# Patient Record
Sex: Female | Born: 1985 | Race: White | Hispanic: No | Marital: Married | Smoking: Current every day smoker
Health system: Southern US, Community
[De-identification: ages and names within clinical notes are randomized; demographics above are authoritative.]

## PROBLEM LIST (undated history)

## (undated) DIAGNOSIS — N390 Urinary tract infection, site not specified: Secondary | ICD-10-CM

---

## 2008-04-22 ENCOUNTER — Emergency Department (HOSPITAL_BASED_OUTPATIENT_CLINIC_OR_DEPARTMENT_OTHER): Admission: EM | Admit: 2008-04-22 | Discharge: 2008-04-22 | Payer: Self-pay | Admitting: Emergency Medicine

## 2008-08-27 ENCOUNTER — Emergency Department (HOSPITAL_BASED_OUTPATIENT_CLINIC_OR_DEPARTMENT_OTHER): Admission: EM | Admit: 2008-08-27 | Discharge: 2008-08-27 | Payer: Self-pay | Admitting: Emergency Medicine

## 2008-08-27 ENCOUNTER — Ambulatory Visit: Payer: Self-pay | Admitting: Radiology

## 2009-11-01 ENCOUNTER — Ambulatory Visit: Payer: Self-pay | Admitting: Diagnostic Radiology

## 2009-11-01 ENCOUNTER — Emergency Department (HOSPITAL_BASED_OUTPATIENT_CLINIC_OR_DEPARTMENT_OTHER): Admission: EM | Admit: 2009-11-01 | Discharge: 2009-11-01 | Payer: Self-pay | Admitting: Emergency Medicine

## 2009-11-15 ENCOUNTER — Ambulatory Visit: Payer: Self-pay | Admitting: Radiology

## 2009-11-15 ENCOUNTER — Emergency Department (HOSPITAL_BASED_OUTPATIENT_CLINIC_OR_DEPARTMENT_OTHER): Admission: EM | Admit: 2009-11-15 | Discharge: 2009-11-15 | Payer: Self-pay | Admitting: Emergency Medicine

## 2009-12-17 ENCOUNTER — Emergency Department (HOSPITAL_BASED_OUTPATIENT_CLINIC_OR_DEPARTMENT_OTHER): Admission: EM | Admit: 2009-12-17 | Discharge: 2009-12-18 | Payer: Self-pay | Admitting: Emergency Medicine

## 2009-12-19 ENCOUNTER — Ambulatory Visit (HOSPITAL_BASED_OUTPATIENT_CLINIC_OR_DEPARTMENT_OTHER): Admission: RE | Admit: 2009-12-19 | Discharge: 2009-12-19 | Payer: Self-pay | Admitting: Emergency Medicine

## 2009-12-19 ENCOUNTER — Ambulatory Visit: Payer: Self-pay | Admitting: Radiology

## 2010-07-23 IMAGING — US US OB COMP LESS 14 WK
1 series · 14 of 28 positions shown · non-contrast
Comparison: 11/15/2009

CLINICAL DATA: Pregnant/pelvic pain

OBSTETRIC <14 WK ULTRASOUND
TECHNIQUE:

[Series 1: us ob comp less 14 wk · 0.22mm/px · 14 of 39 slices shown]
[im 2/39]
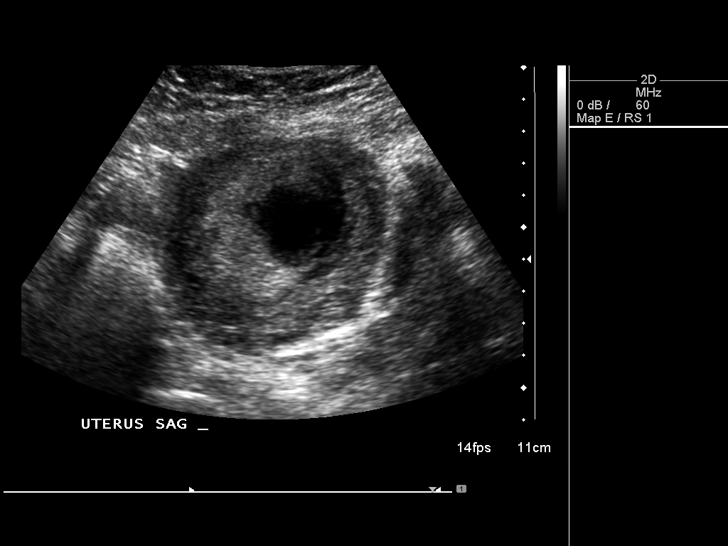
[im 5/39]
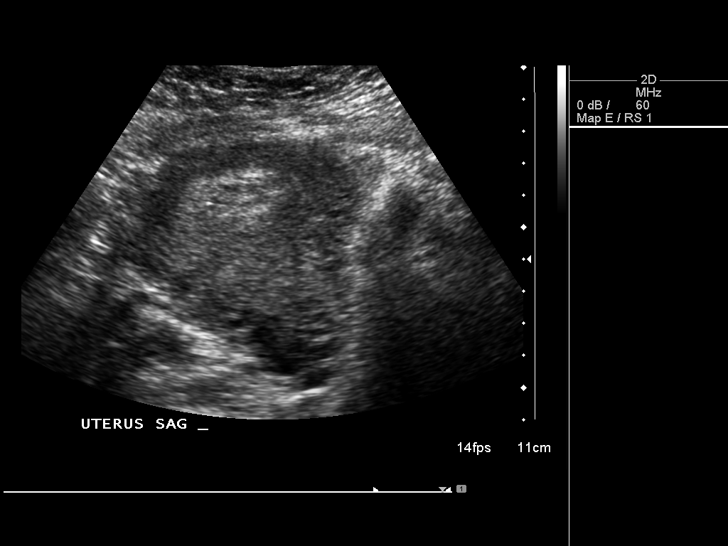
[im 8/39]
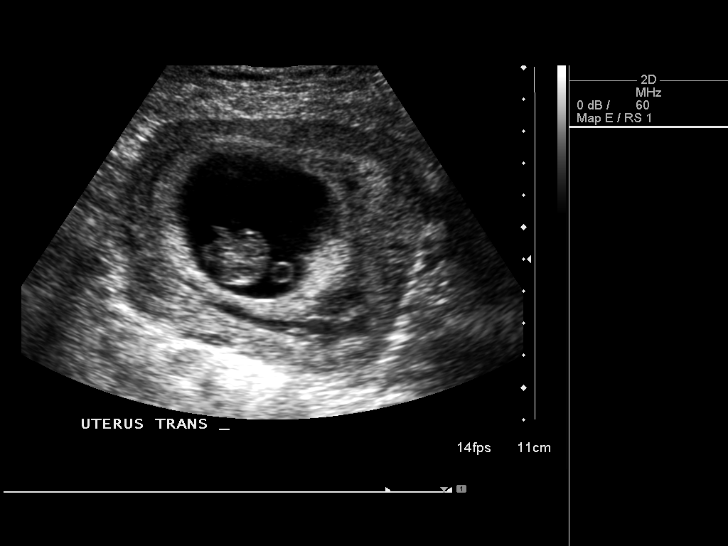
[im 10/39]
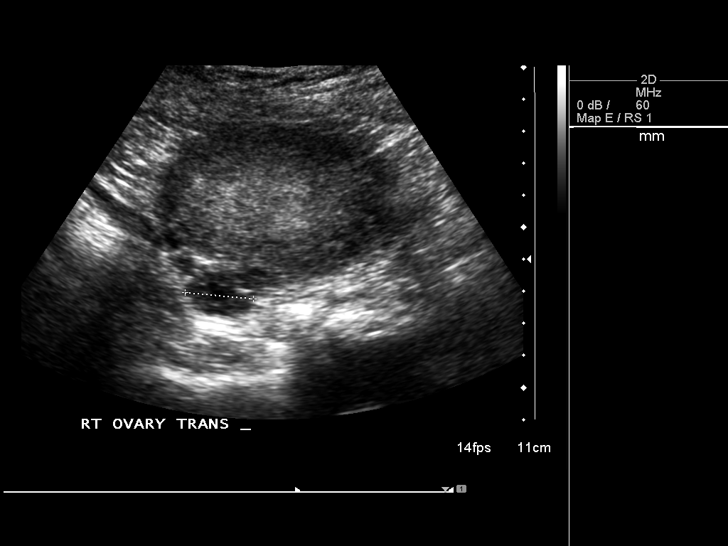
[im 13/39]
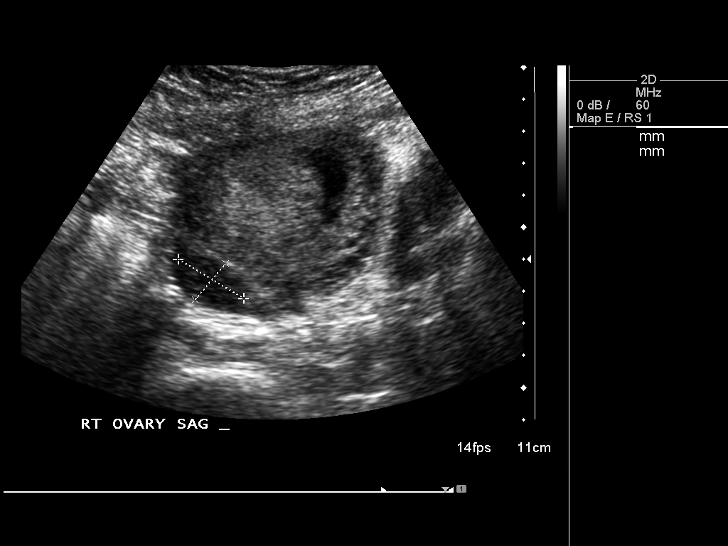
[im 16/39]
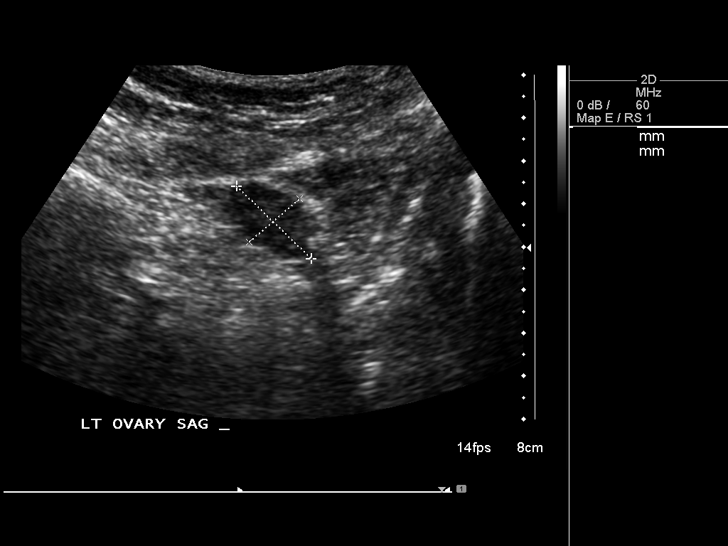
[im 19/39]
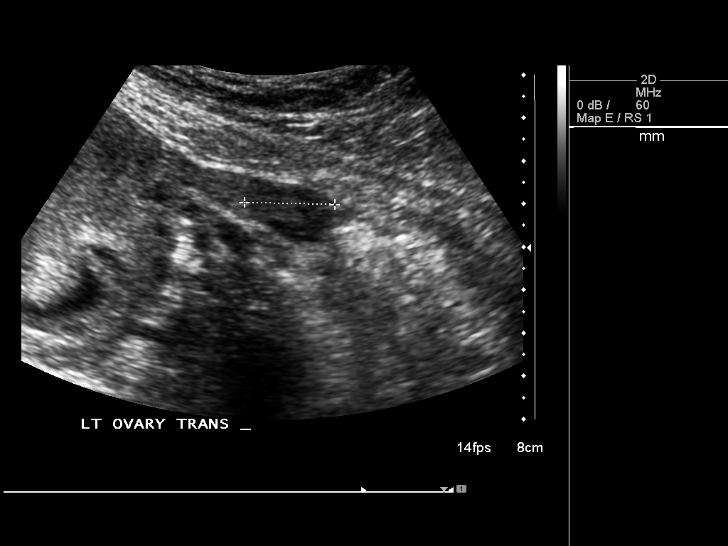
[im 22/39]
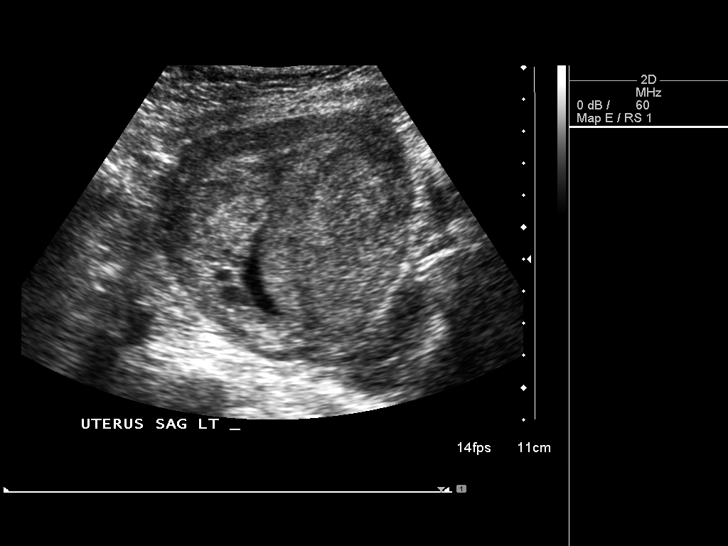
[im 24/39]
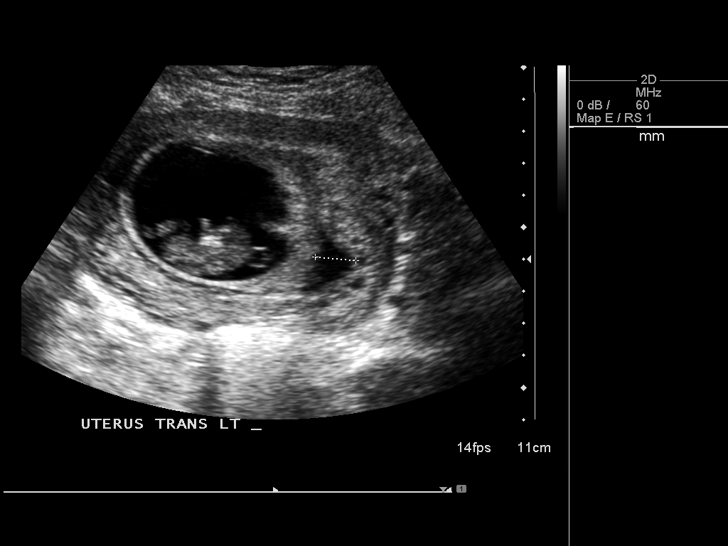
[im 27/39]
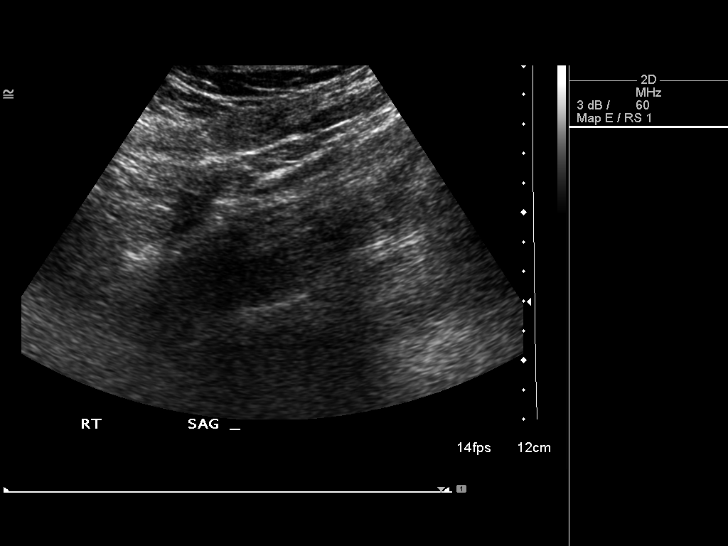
[im 30/39]
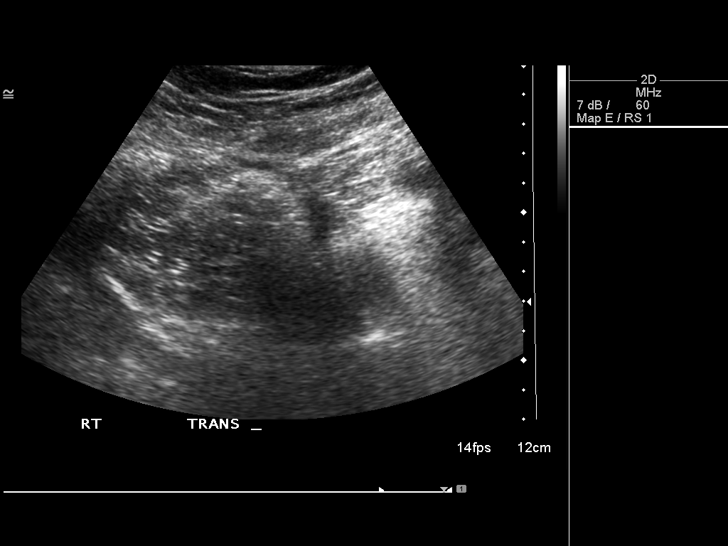
[im 33/39]
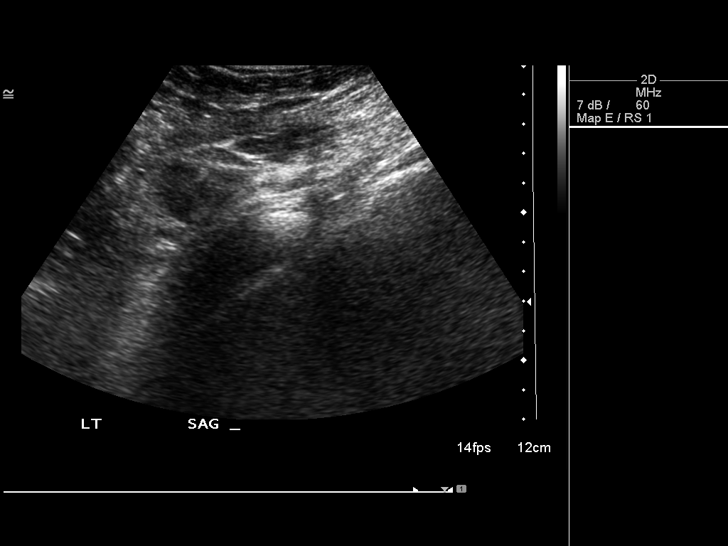
[im 36/39]
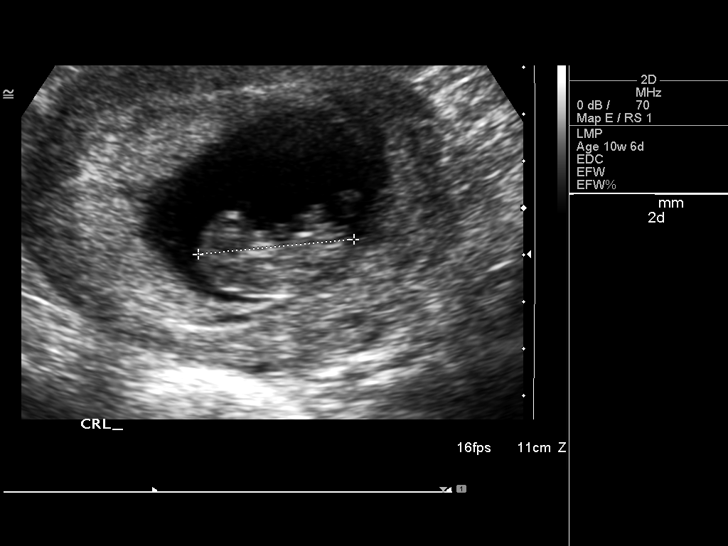
[im 39/39]
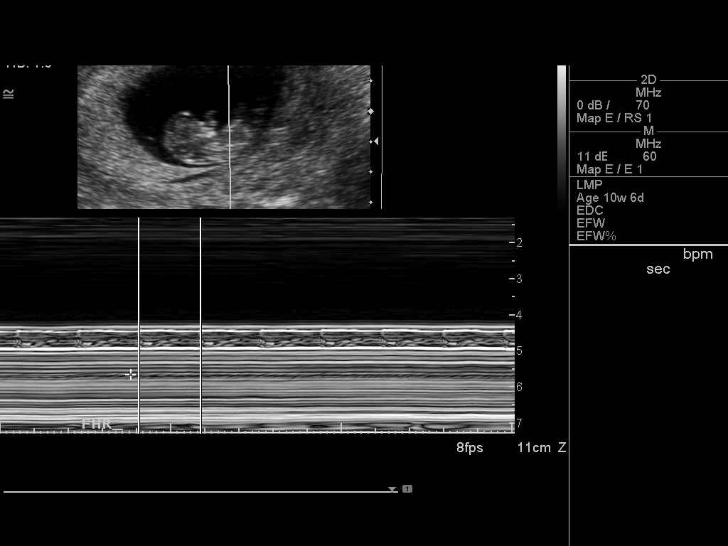

[14 of 28 positions shown; findings below may reference images not displayed]

FINDINGS: There is an intrauterine pregnancy estimated at 10 weeks
1 day based on crown rump length.  Fetal heart activity is recorded
at 165 beats per minute.  There is a probable small to moderate
sized subchorionic hemorrhage.

Ovaries unremarkable.  No adnexal masses or pelvic fluid.
IMPRESSION: 1.  Intrauterine pregnancy estimated at 10 weeks 1 day.  Fetal
heart activity noted at 165 beats per minute.

2.  No other pathological findings.

## 2010-11-07 LAB — URINALYSIS, ROUTINE W REFLEX MICROSCOPIC: Specific Gravity, Urine: 1.02 (ref 1.005–1.030)

## 2010-11-07 LAB — GC/CHLAMYDIA PROBE AMP, GENITAL: Chlamydia, DNA Probe: NEGATIVE

## 2010-11-07 LAB — WET PREP, GENITAL
Trich, Wet Prep: NONE SEEN
Yeast Wet Prep HPF POC: NONE SEEN

## 2010-11-07 LAB — PREGNANCY, URINE: Preg Test, Ur: POSITIVE

## 2010-11-07 LAB — URINE MICROSCOPIC-ADD ON

## 2010-11-12 LAB — BASIC METABOLIC PANEL
GFR calc non Af Amer: >60 mL/min (ref >60)
Glucose, Bld: 85 mg/dL (ref 70–99)
Potassium: 4 mEq/L (ref 3.5–5.1)
Sodium: 140 mEq/L (ref 135–145)

## 2010-11-12 LAB — DIFFERENTIAL
Basophils Relative: 1 % (ref 0–1)
Eosinophils Absolute: 0.1 10*3/uL (ref 0.0–0.7)
Eosinophils Relative: 1 % (ref 0–5)
Lymphocytes Relative: 21 % (ref 12–46)

## 2010-11-12 LAB — URINALYSIS, ROUTINE W REFLEX MICROSCOPIC
Ketones, ur: 15 mg/dL — AB
Ketones, ur: NEGATIVE mg/dL
Protein, ur: 100 mg/dL — AB
Urobilinogen, UA: 0.2 mg/dL (ref 0.0–1.0)

## 2010-11-12 LAB — CBC
HCT: 39.6 % (ref 36.0–46.0)
Hemoglobin: 13.4 g/dL (ref 12.0–15.0)
MCHC: 33.7 g/dL (ref 30.0–36.0)
Platelets: 323 10*3/uL (ref 150–400)
RDW: 13.4 % (ref 11.5–15.5)

## 2010-11-12 LAB — URINE MICROSCOPIC-ADD ON

## 2010-11-12 LAB — URINE CULTURE: Culture: NO GROWTH

## 2010-11-12 LAB — HCG, QUANTITATIVE, PREGNANCY: hCG, Beta Chain, Quant, S: 7126 m[IU]/mL — ABNORMAL HIGH (ref <5)

## 2011-02-09 ENCOUNTER — Emergency Department (HOSPITAL_BASED_OUTPATIENT_CLINIC_OR_DEPARTMENT_OTHER)
Admission: EM | Admit: 2011-02-09 | Discharge: 2011-02-09 | Disposition: A | Discharge status: Home / Self Care | Payer: Self-pay | Attending: Emergency Medicine | Admitting: Emergency Medicine

## 2011-02-09 ENCOUNTER — Emergency Department (INDEPENDENT_AMBULATORY_CARE_PROVIDER_SITE_OTHER): Payer: Self-pay

## 2011-02-09 DIAGNOSIS — O208 Other hemorrhage in early pregnancy: Secondary | ICD-10-CM

## 2011-02-09 DIAGNOSIS — O2690 Pregnancy related conditions, unspecified, unspecified trimester: Secondary | ICD-10-CM | POA: Insufficient documentation

## 2011-02-09 DIAGNOSIS — N390 Urinary tract infection, site not specified: Secondary | ICD-10-CM | POA: Insufficient documentation

## 2011-02-09 DIAGNOSIS — R109 Unspecified abdominal pain: Secondary | ICD-10-CM

## 2011-02-09 DIAGNOSIS — O34599 Maternal care for other abnormalities of gravid uterus, unspecified trimester: Secondary | ICD-10-CM

## 2011-02-09 DIAGNOSIS — O99891 Other specified diseases and conditions complicating pregnancy: Secondary | ICD-10-CM

## 2011-02-09 LAB — URINE MICROSCOPIC-ADD ON

## 2011-02-09 LAB — URINALYSIS, ROUTINE W REFLEX MICROSCOPIC
Bilirubin Urine: NEGATIVE
Glucose, UA: NEGATIVE mg/dL
Ketones, ur: NEGATIVE mg/dL
Nitrite: NEGATIVE
Protein, ur: NEGATIVE mg/dL
Specific Gravity, Urine: 1.023 (ref 1.005–1.030)
Urobilinogen, UA: 1 mg/dL (ref 0.0–1.0)
pH: 6.5 (ref 5.0–8.0)

## 2011-02-09 LAB — CBC
HCT: 37 % (ref 36.0–46.0)
MCHC: 34.1 g/dL (ref 30.0–36.0)
Platelets: 269 10*3/uL (ref 150–400)
RDW: 14.8 % (ref 11.5–15.5)

## 2011-02-09 LAB — HCG, QUANTITATIVE, PREGNANCY: hCG, Beta Chain, Quant, S: 56375 m[IU]/mL — ABNORMAL HIGH

## 2011-02-09 LAB — BASIC METABOLIC PANEL
GFR calc Af Amer: >60 mL/min (ref >60)
GFR calc non Af Amer: >60 mL/min (ref >60)
Potassium: 3.5 mEq/L (ref 3.5–5.1)
Sodium: 137 mEq/L (ref 135–145)

## 2011-02-09 LAB — PREGNANCY, URINE: Preg Test, Ur: POSITIVE

## 2011-03-14 ENCOUNTER — Encounter: Payer: Self-pay | Admitting: *Deleted

## 2011-03-14 ENCOUNTER — Emergency Department (HOSPITAL_BASED_OUTPATIENT_CLINIC_OR_DEPARTMENT_OTHER)
Admission: EM | Admit: 2011-03-14 | Discharge: 2011-03-14 | Disposition: A | Discharge status: Home / Self Care | Payer: Self-pay | Attending: Emergency Medicine | Admitting: Emergency Medicine

## 2011-03-14 DIAGNOSIS — R109 Unspecified abdominal pain: Secondary | ICD-10-CM | POA: Insufficient documentation

## 2011-03-14 DIAGNOSIS — O26899 Other specified pregnancy related conditions, unspecified trimester: Secondary | ICD-10-CM

## 2011-03-14 DIAGNOSIS — O2690 Pregnancy related conditions, unspecified, unspecified trimester: Secondary | ICD-10-CM | POA: Insufficient documentation

## 2011-03-14 LAB — URINE MICROSCOPIC-ADD ON

## 2011-03-14 LAB — URINALYSIS, ROUTINE W REFLEX MICROSCOPIC
Bilirubin Urine: NEGATIVE
Glucose, UA: NEGATIVE mg/dL
Ketones, ur: NEGATIVE mg/dL
Leukocytes, UA: NEGATIVE
Nitrite: NEGATIVE
Protein, ur: NEGATIVE mg/dL
Specific Gravity, Urine: 1.038 — ABNORMAL HIGH (ref 1.005–1.030)
Urobilinogen, UA: 0.2 mg/dL (ref 0.0–1.0)
pH: 5.5 (ref 5.0–8.0)

## 2011-03-14 MED ORDER — ONDANSETRON HCL 4 MG PO TABS: 4.0000 mg | ORAL_TABLET | Freq: Four times a day (QID) | ORAL | Status: AC

## 2011-03-14 NOTE — ED Provider Notes (Signed)
History     Chief Complaint  Patient presents with  . Abdominal Pain   Patient is a 25 y.o. female presenting with abdominal pain. The history is provided by the patient.  Abdominal Pain The primary symptoms of the illness include abdominal pain, nausea and hematemesis. The primary symptoms of the illness do not include fever, shortness of breath, vomiting, diarrhea, dysuria, vaginal discharge or vaginal bleeding. The current episode started 13 to 24 hours ago. The onset of the illness was gradual. Progression since onset: comes and goes with position and movement.  Pain Location: bilateral lower pelvic region. The abdominal pain is relieved by certain positions. Exacerbated by: nothing.  The hematemesis is not associated with heartburn, weakness or dizziness.  The patient states that she believes she is currently pregnant. The patient has not had a change in bowel habit. Symptoms associated with the illness do not include chills, diaphoresis, heartburn, urgency, frequency or back pain. Significant associated medical issues do not include diabetes.  LMP 12/06/10 is G7P6 and unable to get into her OB office.  She sttrated having this position discomfort tonight and believes it is round ligament pains, she took tylenol around 11pm and is now feeling better, she has been having some am emesis that she attributes to pregnancy and does not feel likel it is related to this pain tonight. No trauma, no vag bleeding, is taking PNVs.   History reviewed. No pertinent past medical history.  History reviewed. No pertinent past surgical history.  History reviewed. No pertinent family history.  History  Substance Use Topics  . Smoking status: Never Smoker   . Smokeless tobacco: Not on file  . Alcohol Use: No    OB History    Grav Para Term Preterm Abortions TAB SAB Ect Mult Living   7 6 5 1      6       Review of Systems  Constitutional: Negative for fever, chills and diaphoresis.  HENT: Negative  for congestion and neck pain.   Eyes: Negative for discharge.  Respiratory: Negative for shortness of breath.   Cardiovascular: Negative for chest pain and leg swelling.  Gastrointestinal: Positive for nausea, abdominal pain and hematemesis. Negative for heartburn, vomiting and diarrhea.  Genitourinary: Negative for dysuria, urgency, frequency, vaginal bleeding, vaginal discharge and vaginal pain.  Musculoskeletal: Negative for back pain.  Skin: Negative for rash.  Neurological: Negative for dizziness and weakness.  All other systems reviewed and are negative.    Physical Exam  BP 105/63  Pulse 76  Temp(Src) 97.5 F (36.4 C) (Oral)  Resp 18  Ht 5\' 3"  (1.6 m)  Wt 165 lb (74.844 kg)  BMI 29.23 kg/m2  SpO2 100%  LMP 12/06/2010  Physical Exam  Constitutional: She is oriented to person, place, and time. She appears well-developed and well-nourished.  HENT:  Head: Normocephalic and atraumatic.  Eyes: Conjunctivae and EOM are normal. Pupils are equal, round, and reactive to light.  Neck: Trachea normal. Neck supple. No thyromegaly present.  Cardiovascular: Normal rate, regular rhythm, S1 normal, S2 normal and normal pulses.     No systolic murmur is present   No diastolic murmur is present  Pulses:      Radial pulses are 2+ on the right side, and 2+ on the left side.  Pulmonary/Chest: Effort normal and breath sounds normal. She has no wheezes. She has no rhonchi. She has no rales. She exhibits no tenderness.  Abdominal: Soft. Normal appearance and bowel sounds are normal. There is  no tenderness. There is no rigidity, no CVA tenderness and negative Murphy's sign. No hernia.       Gravid uterus, no point tenderness and is NTTP over McBurneys point and neg Murphys sign. No peritonits or rebound. Localizes discomfort to bilateral lower inguinal and pelvic region  Musculoskeletal:       BLE:s Calves nontender, no cords or erythema, negative Homans sign  Neurological: She is alert and  oriented to person, place, and time. She has normal strength. No cranial nerve deficit or sensory deficit. GCS eye subscore is 4. GCS verbal subscore is 5. GCS motor subscore is 6.  Skin: Skin is warm and dry. No rash noted. She is not diaphoretic.  Psychiatric: Her speech is normal.       Cooperative and appropriate    ED Course  [place] bedside Date/Time: 03/14/2011 3:40 AM Performed by: Sunnie Nielsen Authorized by: Sunnie Nielsen Consent: Verbal consent obtained. Risks and benefits: risks, benefits and alternatives were discussed Consent given by: patient Time out: Immediately prior to procedure a "time out" was called to verify the correct patient, procedure, equipment, support staff and site/side marked as required. Comments: First trimester preg [place] for ABD pain and known pregnancy: FHR measured 162 and estimated gest age 37 weeks 4 days by BPD    MDM No RLQ pain or tenderness, tylenol improved symptoms, suspect round ligament pains, plan close OB GYN follow up with [facility] referral.  No UTI symptoms. Reliable historian agrees to strict return precautions and f/u instructions.       Sunnie Nielsen, MD 03/14/11 [phone]

## 2011-03-14 NOTE — ED Notes (Signed)
Pt c/o lower abd pain and back pain for few hours. Pt denies urinary sxs or fever. Pt states that her LMP was in April; and has had + positive pregnancy tests. Was seen here last month for the same.

## 2011-03-14 NOTE — ED Notes (Signed)
Dr Dierdre Highman with pt

## 2011-03-14 NOTE — ED Notes (Signed)
Pt denies any abd pain only pressure when urinating, same sxs as last month when she was here and dx'd with UTI. Denies any blood or vaginal discharge. Denies fever. Pt has had no prenatal care up to this point due to being denied medicaid but states that she is taking prenatal vitamins.

## 2011-03-14 NOTE — ED Notes (Signed)
BS ultrasound performed by Dr Dierdre Highman, pt tolerated well. Pt given PO fluids per request.

## 2011-05-23 LAB — BASIC METABOLIC PANEL
CO2: 29
Calcium: 9.3
Creatinine, Ser: 0.7
GFR calc Af Amer: >60
GFR calc non Af Amer: >60
Glucose, Bld: 87
Sodium: 141

## 2011-05-23 LAB — DIFFERENTIAL
Basophils Absolute: 0
Basophils Relative: 0
Lymphocytes Relative: 22
Monocytes Absolute: 0.4
Neutro Abs: 6.2
Neutrophils Relative %: 71

## 2011-05-23 LAB — CBC
Hemoglobin: 14.7
MCHC: 34
Platelets: 279
RDW: 14

## 2011-05-23 LAB — D-DIMER, QUANTITATIVE: D-Dimer, Quant: 0.26

## 2011-10-15 ENCOUNTER — Emergency Department (HOSPITAL_BASED_OUTPATIENT_CLINIC_OR_DEPARTMENT_OTHER)
Admission: EM | Admit: 2011-10-15 | Discharge: 2011-10-15 | Disposition: A | Discharge status: Home / Self Care | Payer: Medicaid Other | Attending: Emergency Medicine | Admitting: Emergency Medicine

## 2011-10-15 ENCOUNTER — Encounter (HOSPITAL_BASED_OUTPATIENT_CLINIC_OR_DEPARTMENT_OTHER): Payer: Self-pay

## 2011-10-15 DIAGNOSIS — S161XXA Strain of muscle, fascia and tendon at neck level, initial encounter: Secondary | ICD-10-CM

## 2011-10-15 DIAGNOSIS — X503XXA Overexertion from repetitive movements, initial encounter: Secondary | ICD-10-CM | POA: Insufficient documentation

## 2011-10-15 DIAGNOSIS — F17200 Nicotine dependence, unspecified, uncomplicated: Secondary | ICD-10-CM | POA: Insufficient documentation

## 2011-10-15 DIAGNOSIS — M25519 Pain in unspecified shoulder: Secondary | ICD-10-CM | POA: Insufficient documentation

## 2011-10-15 DIAGNOSIS — Y92009 Unspecified place in unspecified non-institutional (private) residence as the place of occurrence of the external cause: Secondary | ICD-10-CM | POA: Insufficient documentation

## 2011-10-15 DIAGNOSIS — S139XXA Sprain of joints and ligaments of unspecified parts of neck, initial encounter: Secondary | ICD-10-CM | POA: Insufficient documentation

## 2011-10-15 MED ORDER — IBUPROFEN 100 MG/5ML PO SUSP
800.0000 mg | Freq: Once | ORAL | Status: AC
  Administered 2011-10-15: 800 mg via ORAL
  Filled 2011-10-15: qty 40

## 2011-10-15 MED ORDER — IBUPROFEN 100 MG/5ML PO SUSP: ORAL | Status: DC

## 2011-10-15 NOTE — ED Provider Notes (Signed)
Medical screening examination/treatment/procedure(s) were performed by non-physician practitioner and as supervising physician I was immediately available for consultation/collaboration.   Glynn Octave, MD 10/15/11 [phone]

## 2011-10-15 NOTE — ED Provider Notes (Signed)
History     CSN: [id number]  Arrival date & time 10/15/11  1140   First MD Initiated Contact with Patient 10/15/11 1153      Chief Complaint  Patient presents with  . Shoulder Pain    (Consider location/radiation/quality/duration/timing/severity/associated sxs/prior treatment) HPI  Patient presents to ER complaining of gradual onset left lateral lower neck/upper shoulder pain that began 6 days ago stating that she moved furniture all day one day and then by the evening time noticed "soreness in neck and shoulder" stating that she woke the next day with increased pain that has been persistent since onset. Patient states pain is aggravated by movement of neck and shoulder but denies pain without shoulder joint. Patient states she is currently breast feeding and has taken tylenol without relief of symptoms. Patient notes that she is unable to swallow pills and therefore takes medication in liquid form. Patient denies extremity numbness/tingling/weakness. She states that she has 7 children and is "never able to take break from doing work around the house" stating that neck and shoulder ache more so at the end of the day. She has no known medical problems and takes no meds on a regular basis.   History reviewed. No pertinent past medical history.  History reviewed. No pertinent past surgical history.  History reviewed. No pertinent family history.  History  Substance Use Topics  . Smoking status: Current Everyday Smoker -- 1.0 packs/day    Types: Cigarettes  . Smokeless tobacco: Never Used  . Alcohol Use: No    OB History    Grav Para Term Preterm Abortions TAB SAB Ect Mult Living   7 6 5 1      6       Review of Systems  All other systems reviewed and are negative.    Allergies  Review of patient's allergies indicates no known allergies.  Home Medications   Current Outpatient Rx  Name Route Sig Dispense Refill  . ACETAMINOPHEN 500 MG PO TABS Oral Take 1,000 mg by mouth  every 6 (six) hours as needed. Pt states that she is taking an elixir of 500mg /tablespoon.    . IBUPROFEN 100 MG/5ML PO SUSP  Take 600mg  every 6-8 hours as needed for inflammation and pain 473 mL 0    BP 128/74  Pulse 75  Temp(Src) 97.6 F (36.4 C) (Oral)  Resp 18  Ht 5\' 3"  (1.6 m)  SpO2 99%  LMP 12/06/2010  Physical Exam  Constitutional: She is oriented to person, place, and time. She appears well-developed and well-nourished. No distress.  HENT:  Head: Normocephalic and atraumatic.  Eyes: Conjunctivae are normal.  Neck: Normal range of motion. Neck supple. No tracheal deviation present.       Soft tissue TTP of left lateral neck into trapezius muscle but no skin changes or crepitous  Cardiovascular: Normal rate, regular rhythm, S1 normal, S2 normal, normal heart sounds and intact distal pulses.   Pulmonary/Chest: Effort normal and breath sounds normal. No respiratory distress. She has no wheezes. She has no rales. She exhibits no tenderness and no crepitus.  Abdominal: Soft. Normal appearance and bowel sounds are normal. She exhibits no distension and no mass. There is no tenderness. There is no rebound and no guarding.  Musculoskeletal: Normal range of motion. She exhibits no edema and no tenderness.       Right shoulder: She exhibits normal range of motion, no tenderness, no swelling, no effusion and no deformity.       FROM of  bilateral UE and LE with mild pain in left trapezius region with ROM of shoulder but no pain in left shoulder joint. Negative drop can test. 5/5 strength of bilateral UE with FROM of UE.   Neurological: She is alert and oriented to person, place, and time. She has normal reflexes.  Skin: Skin is warm and dry. She is not diaphoretic.  Psychiatric: She has a normal mood and affect.    ED Course  Procedures (including critical care time)  PO ibuprofen  Labs Reviewed - No data to display No results found.   1. Cervical strain       MDM  MSK soft  tissue TTP of trapezius region without signs or symptoms of central cord compression. FROM of UE with 5/5 strength and neurovasc intact.         Jenness Corner, [place] 10/15/11 1225

## 2011-10-15 NOTE — ED Notes (Signed)
Pt states that she has been moving furniture and injured her L shoulder during this process.  Pt has been taking tylenol for the pain with no relief.  Pt states that pain gets worse with movement of arm, and when she turns her neck.  ROM good, pulses present and equal in both upper extremities, sensation present and equal.

## 2011-10-15 NOTE — Discharge Instructions (Signed)
Apply ice to neck/shoulder for 15 minutes out of every waking hour for the next 24-48 hours then move to moist heat therapy. Use Moist heat therapy by taking a dish rag and soaking in water then ringing out excess water then microwave for 10-15 seconds until hot but not so hot that it would burn and heat to areas of soreness for 15 minutes, multiple times a day. Alternate between tylenol and ibuprofen for pain. Establishment with a Primary Care provider is Very important for general health care concerns, minor illness and minor injury.   Cervical Sprain and Strain A cervical sprain is an injury to the neck. The injury can include either over-stretching or even small tears in the ligaments that hold the bones of the neck in place. A strain affects muscles and tendons. Minor injuries usually only involve ligaments and muscles. Because the different parts of the neck are so close together, more severe injuries can involve both sprain and strain. These injuries can affect the muscles, ligaments, tendons, discs, and nerves in the neck. CAUSES  An injury may be the result of a direct blow or from certain habits that can lead to the symptoms noted above.  Injury from:   Contact sports (such as football, rugby, wrestling, hockey, auto racing, gymnastics, diving, martial arts, and boxing).   Motor vehicle accidents.   Whiplash injuries (see image at right). These are common. They occur when the neck is forcefully whipped or forced backward and/or forward.   Falls.   Lifestyle or awkward postures:   Cradling a telephone between the ear and shoulder.   Sitting in a chair that offers no support.   Working at an Theme park manager station.   Activities that require hours of repeated or long periods of looking up (stretching the neck backward) or looking down (bending the head/neck forward).  SYMPTOMS   Pain, soreness, stiffness, or burning sensation in the front, back, or sides of the neck. This  may develop immediately after injury. Onset of discomfort may also develop slowly and not begin for 24 hours or more.   Shoulder and/or upper back pain.   Limits to the normal movement of the neck.   Headache.   Dizziness.   Weakness and/or abnormal sensation (such as numbness or tingling) of one or both arms and/or hands.   Muscle spasm.   Difficulty with swallowing or chewing.   Tenderness and swelling at the injury site.  DIAGNOSIS  Most of the time, your caregiver can diagnose this problem with a careful history and examination. The history will include information about known problems (such as arthritis in the neck) or a previous neck injury. X-rays may be ordered to find out if there is a different problem. X-rays can also help to find problems with the bones of the neck not related to the injury or current symptoms. TREATMENT  Several treatment options are available to help pain, spasm, and other symptoms. They include:  Cold helps relieve pain and reduce inflammation. Cold should be applied for 10 to 15 minutes every 2 to 3 hours after any activity that aggravates your symptoms. Use ice packs or an ice massage. Place a towel or cloth in between your skin and the ice pack.   Medication:   Only take over-the-counter or prescription medicines for pain, discomfort, or fever as directed by your caregiver.   Pain relievers or muscle relaxants may be prescribed. Use only as directed and only as much as you need.   Change  in the activity that caused the problem. This might include using a headset with a telephone so that the phone is not propped between your ear and shoulder.   Neck collar. Your caregiver may recommend temporary use of a soft cervical collar.   Work station. Changes may be needed in your work place. A better sitting position and/or better posture during work may be part of your treatment.   Physical Therapy. Your caregiver may recommend physical therapy. This can  include instructions in the use of stretching and strengthening exercises. Improvement in posture is important. Exercises and posture training can help stabilize the neck and strengthen muscles and keep symptoms from returning.  HOME CARE INSTRUCTIONS  Other than formal physical therapy, all treatments above can be done at home. Even when not at work, it is important to be conscious of your posture and of activities that can cause a return of symptoms. Most cervical sprains and/or strains are better in 1-3 weeks. As you improve and increase activities, doing a warm up and stretching before the activity will help prevent recurrent problems. SEEK MEDICAL CARE IF:   Pain is not effectively controlled with medication.   You feel unable to decrease pain medication over time as planned.   Activity level is not improving as planned and/or expected.  SEEK IMMEDIATE MEDICAL CARE IF:   While using medication, you develop any bleeding, stomach upset, or signs of an allergic reaction.   Symptoms get worse, become intolerable, and are not helped by medications.   New, unexplained symptoms develop.   You experience numbness, tingling, weakness, or paralysis of any part of your body.  MAKE SURE YOU:   Understand these instructions.   Will watch your condition.   Will get help right away if you are not doing well or get worse.  Document Released: 06/03/2007 Document Revised: 04/18/2011 Document Reviewed: 06/03/2007 [facility] Patient Information 2012 [place], [place].

## 2011-11-07 ENCOUNTER — Emergency Department (HOSPITAL_BASED_OUTPATIENT_CLINIC_OR_DEPARTMENT_OTHER)
Admission: EM | Admit: 2011-11-07 | Discharge: 2011-11-07 | Disposition: A | Discharge status: Home / Self Care | Payer: Medicaid Other | Attending: Emergency Medicine | Admitting: Emergency Medicine

## 2011-11-07 ENCOUNTER — Emergency Department (INDEPENDENT_AMBULATORY_CARE_PROVIDER_SITE_OTHER): Payer: Medicaid Other

## 2011-11-07 ENCOUNTER — Encounter (HOSPITAL_BASED_OUTPATIENT_CLINIC_OR_DEPARTMENT_OTHER): Payer: Self-pay | Admitting: Student

## 2011-11-07 DIAGNOSIS — R109 Unspecified abdominal pain: Secondary | ICD-10-CM

## 2011-11-07 DIAGNOSIS — E876 Hypokalemia: Secondary | ICD-10-CM

## 2011-11-07 LAB — URINALYSIS, ROUTINE W REFLEX MICROSCOPIC
Nitrite: NEGATIVE
Protein, ur: 100 mg/dL — AB
Specific Gravity, Urine: 1.038 — ABNORMAL HIGH (ref 1.005–1.030)
Urobilinogen, UA: 1 mg/dL (ref 0.0–1.0)

## 2011-11-07 LAB — URINE MICROSCOPIC-ADD ON

## 2011-11-07 LAB — PREGNANCY, URINE: Preg Test, Ur: NEGATIVE

## 2011-11-07 LAB — BASIC METABOLIC PANEL
CO2: 27 mEq/L (ref 19–32)
Calcium: 9.5 mg/dL (ref 8.4–10.5)
Creatinine, Ser: 0.6 mg/dL (ref 0.50–1.10)
GFR calc non Af Amer: >90 mL/min (ref >90)
Sodium: 139 mEq/L (ref 135–145)

## 2011-11-07 MED ORDER — ONDANSETRON HCL 4 MG/2ML IJ SOLN
4.0000 mg | Freq: Once | INTRAMUSCULAR | Status: DC
  Filled 2011-11-07: qty 2

## 2011-11-07 MED ORDER — MORPHINE SULFATE 4 MG/ML IJ SOLN
4.0000 mg | Freq: Once | INTRAMUSCULAR | Status: DC
  Filled 2011-11-07: qty 1

## 2011-11-07 NOTE — ED Notes (Signed)
Pt in with c/o sudden onset of right flank pain radiating to right lower quad at 0400 this morning. +N but denies V D CP LOC SOB or dysuria

## 2011-11-07 NOTE — ED Provider Notes (Signed)
History     CSN: [id number]  Arrival date & time 11/07/11  1554   First MD Initiated Contact with Patient 11/07/11 1617      Chief Complaint  Patient presents with  . Flank Pain    right  . Abdominal Pain    right    (Consider location/radiation/quality/duration/timing/severity/associated sxs/prior treatment) HPI Comments: Pt states that she had a baby 6 weeks ago and had normal check up with her ob this week  Patient is a 26 y.o. female presenting with flank pain and abdominal pain. The history is provided by the patient.  Flank Pain This is a new problem. The current episode started today. The problem occurs intermittently. The problem has been unchanged. Associated symptoms include abdominal pain. Pertinent negatives include no coughing, fever, headaches or vomiting. The symptoms are aggravated by nothing. She has tried nothing for the symptoms.  Abdominal Pain The primary symptoms of the illness include abdominal pain. The primary symptoms of the illness do not include fever or vomiting.    History reviewed. No pertinent past medical history.  History reviewed. No pertinent past surgical history.  History reviewed. No pertinent family history.  History  Substance Use Topics  . Smoking status: Current Everyday Smoker -- 1.0 packs/day    Types: Cigarettes  . Smokeless tobacco: Never Used  . Alcohol Use: No    OB History    Grav Para Term Preterm Abortions TAB SAB Ect Mult Living   7 6 5 1      6       Review of Systems  Constitutional: Negative for fever.  Respiratory: Negative for cough.   Gastrointestinal: Positive for abdominal pain. Negative for vomiting.  Genitourinary: Positive for flank pain.  Neurological: Negative for headaches.  All other systems reviewed and are negative.    Allergies  Review of patient's allergies indicates no known allergies.  Home Medications   Current Outpatient Rx  Name Route Sig Dispense Refill  . IBUPROFEN 100 MG/5ML  PO SUSP  Take 600mg  every 6-8 hours as needed for inflammation and pain 473 mL 0    BP 122/82  Pulse 80  Temp(Src) 98.2 F (36.8 C) (Oral)  Resp 20  Wt 176 lb (79.833 kg)  SpO2 98%  LMP 12/06/2010  Breastfeeding? Yes  Physical Exam  Nursing note and vitals reviewed. Constitutional: She is oriented to person, place, and time. She appears well-developed and well-nourished.  Eyes: Conjunctivae and EOM are normal.  Neck: Neck supple.  Cardiovascular: Normal rate and regular rhythm.   Pulmonary/Chest: Effort normal and breath sounds normal.  Abdominal: Soft. Bowel sounds are normal. There is no tenderness. There is no CVA tenderness.  Musculoskeletal: Normal range of motion.  Neurological: She is alert and oriented to person, place, and time.  Skin: Skin is warm and dry.  Psychiatric: She has a normal mood and affect.    ED Course  Procedures (including critical care time)  Labs Reviewed  URINALYSIS, ROUTINE W REFLEX MICROSCOPIC - Abnormal; Notable for the following:    APPearance CLOUDY (*)    Specific Gravity, Urine 1.038 (*)    Hgb urine dipstick MODERATE (*)    Protein, ur 100 (*)    Leukocytes, UA TRACE (*)    All other components within normal limits  BASIC METABOLIC PANEL - Abnormal; Notable for the following:    Potassium 3.1 (*)    All other components within normal limits  URINE MICROSCOPIC-ADD ON - Abnormal; Notable for the following:  Squamous Epithelial / LPF FEW (*)    Bacteria, UA FEW (*)    All other components within normal limits  PREGNANCY, URINE  URINE CULTURE   Ct Abdomen Pelvis Wo Contrast  11/07/2011  *RADIOLOGY REPORT*  Clinical Data: FLANK PAIN ABDOMINAL PAIN  CT ABDOMEN AND PELVIS WITHOUT CONTRAST  Technique:  Multidetector CT imaging of the abdomen and pelvis was performed following the standard protocol without intravenous contrast.  Comparison: None.  Findings:  Visualized lung bases are clear.  Liver is normal.  Spleen is normal.  Pancreas  is normal.  The adrenal glands are normal.   The kidneys are normal.  Gallbladder is unremarkable by CT.  No biliary ductal dilation.  Stomach and visualized large and small bowel are unremarkable.  Abdominal aorta is normal in caliber.  No significant lymphadenopathy.  No free fluid or abnormal fluid collections.  Appendix identified and normal.  Visualized colon and small bowel are unremarkable.  No free fluid or abnormal fluid collections.  No significant lymphadenopathy.  Urinary bladder is normal.  IMPRESSION:  1.  There is no evidence for nephrolithiasis or obstructive uropathy. 2.  No ureterolithiasis identified.  3.  The appendix is visualized and appears normal.  Original Report Authenticated By: Rosealee Albee, M.D.     1. Flank pain   2. Hypokalemia       MDM  No acute findings:pt has chronic history of hematuria:no definite infection:urine culture sent:likely musculoskeletal       Teressa Lower, NP 11/07/11 Rickey Primus

## 2011-11-07 NOTE — ED Notes (Signed)
Cleatrice Burke, RN stated pt refused pain/nausea med at present-EDP [facility] notified

## 2011-11-07 NOTE — Discharge Instructions (Signed)
Flank Pain Flank pain is pain in your side.  HOME CARE  Home care and treatment will depend on the cause of your pain.   Some medicines can help relieve the pain. Take all medicine as told by your doctor.   Tell your doctor about any changes in your pain.   Follow up with your doctor.  GET HELP RIGHT AWAY IF:   Your pain does not get better with medicine.   The pain gets worse.   You have belly (abdominal) pain.   You are short of breath.   You always feel sick to your stomach (nauseous).   You keep throwing up (vomiting).   You have puffiness (swelling) in your belly.   You pass out (faint).   You have a temperature by mouth above 102 F (38.9 C), not controlled by medicine.  MAKE SURE YOU:   Understand these instructions.   Will watch your condition.   Will get help right away if you are not doing well or get worse.  Document Released: 05/15/2008 Document Revised: 07/26/2011 Document Reviewed: 01/21/2010 [facility] Patient Information 2012 [place], [place].Hypokalemia Hypokalemia means a low potassium level in the blood.Potassium is an electrolyte that helps regulate the amount of fluid in the body. It also stimulates muscle contraction and maintains a stable acid-base balance.Most of the body's potassium is inside of cells, and only a very small amount is in the blood. Because the amount in the blood is so small, minor changes can have big effects. PREPARATION FOR TEST Testing for potassium requires taking a blood sample taken by needle from a vein in the arm. The skin is cleaned thoroughly before the sample is drawn. There is no other special preparation needed. NORMAL VALUES Potassium levels below 3.5 mEq/L are abnormally low. Levels above 5.1 mEq/L are abnormally high. Ranges for normal findings may vary among different laboratories and hospitals. You should always check with your doctor after having lab work or other tests done to discuss the meaning of your test  results and whether your values are considered within normal limits. MEANING OF TEST  Your caregiver will go over the test results with you and discuss the importance and meaning of your results, as well as treatment options and the need for additional tests, if necessary. A potassium level is frequently part of a routine medical exam. It is usually included as part of a whole "panel" of tests for several blood salts (such as Sodium and Chloride). It may be done as part of follow-up when a low potassium level was found in the past or other blood salts are suspected of being out of balance. A low potassium level might be suspected if you have one or more of the following:  Symptoms of weakness.   Abnormal heart rhythms.   High blood pressure and are taking medication to control this, especially water pills (diuretics).   Kidney disease that can affect your potassium level .   Diabetes requiring the use of insulin. The potassium may fall after taking insulin, especially if the diabetes had been out of control for a while.   A condition requiring the use of cortisone-type medication or certain types of antibiotics.   Vomiting and/or diarrhea for more than a day or two.   A stomach or intestinal condition that may not permit appropriate absorption of potassium.   Fainting episodes.   Mental confusion.  OBTAINING TEST RESULTS It is your responsibility to obtain your test results. Ask the lab or department  performing the test when and how you will get your results.  Please contact your caregiver directly if you have not received the results within one week. At that time, ask if there is anything different or new you should be doing in relation to the results. TREATMENT Hypokalemia can be treated with potassium supplements taken by mouth and/or adjustments in your current medications. A diet high in potassium is also helpful. Foods with high potassium content are:  Peas, lentils, lima  beans, nuts, and dried fruit.   Whole grain and bran cereals and breads.   Fresh fruit, vegetables (bananas, cantaloupe, grapefruit, oranges, tomatoes, honeydew melons, potatoes).   Orange and tomato juices.   Meats. If potassium supplement has been prescribed for you today or your medications have been adjusted, see your personal caregiver in time02 for a re-check.  SEEK MEDICAL CARE IF:  There is a feeling of worsening weakness.   You experience repeated chest palpitations.   You are diabetic and having difficulty keeping your blood sugars in the normal range.   You are experiencing vomiting and/or diarrhea.   You are having difficulty with any of your regular medications.  SEEK IMMEDIATE MEDICAL CARE IF:  You experience chest pain, shortness of breath, or episodes of dizziness.   You have been having vomiting or diarrhea for more than 2 days.   You have a fainting episode.  MAKE SURE YOU:   Understand these instructions.   Will watch your condition.   Will get help right away if you are not doing well or get worse.  Document Released: 08/06/2005 Document Revised: 07/26/2011 Document Reviewed: 07/17/2008 [facility] Patient Information 2012 [place], [place].

## 2011-11-07 NOTE — ED Notes (Signed)
Urine collected at bedside.

## 2011-11-08 LAB — URINE CULTURE
Colony Count: 15000
Culture  Setup Time: 201303210012

## 2011-11-08 NOTE — ED Provider Notes (Signed)
Medical screening examination/treatment/procedure(s) were performed by non-physician practitioner and as supervising physician I was immediately available for consultation/collaboration.   Gwyneth Sprout, MD 11/08/11 1536

## 2013-06-30 ENCOUNTER — Emergency Department (HOSPITAL_BASED_OUTPATIENT_CLINIC_OR_DEPARTMENT_OTHER)
Admission: EM | Admit: 2013-06-30 | Discharge: 2013-06-30 | Disposition: A | Discharge status: Home / Self Care | Payer: PRIVATE HEALTH INSURANCE | Attending: Emergency Medicine | Admitting: Emergency Medicine

## 2013-06-30 ENCOUNTER — Encounter (HOSPITAL_BASED_OUTPATIENT_CLINIC_OR_DEPARTMENT_OTHER): Payer: Self-pay | Admitting: Emergency Medicine

## 2013-06-30 DIAGNOSIS — N390 Urinary tract infection, site not specified: Secondary | ICD-10-CM | POA: Insufficient documentation

## 2013-06-30 DIAGNOSIS — Z3202 Encounter for pregnancy test, result negative: Secondary | ICD-10-CM | POA: Insufficient documentation

## 2013-06-30 DIAGNOSIS — F17200 Nicotine dependence, unspecified, uncomplicated: Secondary | ICD-10-CM | POA: Insufficient documentation

## 2013-06-30 HISTORY — DX: Urinary tract infection, site not specified: N39.0

## 2013-06-30 LAB — URINALYSIS, ROUTINE W REFLEX MICROSCOPIC
Bilirubin Urine: NEGATIVE
Glucose, UA: NEGATIVE mg/dL
Ketones, ur: NEGATIVE mg/dL
Urobilinogen, UA: 0.2 mg/dL (ref 0.0–1.0)
pH: 6 (ref 5.0–8.0)

## 2013-06-30 LAB — URINE MICROSCOPIC-ADD ON

## 2013-06-30 LAB — PREGNANCY, URINE: Preg Test, Ur: NEGATIVE

## 2013-06-30 MED ORDER — SULFAMETHOXAZOLE-TRIMETHOPRIM 200-40 MG/5ML PO SUSP: 20.0000 mL | Freq: Two times a day (BID) | ORAL | Status: AC

## 2013-06-30 MED ORDER — LIDOCAINE HCL (PF) 1 % IJ SOLN
INTRAMUSCULAR | Status: AC
  Administered 2013-06-30: 2.1 mL
  Filled 2013-06-30: qty 5

## 2013-06-30 MED ORDER — HYDROCODONE-ACETAMINOPHEN 7.5-325 MG/15ML PO SOLN: 15.0000 mL | Freq: Four times a day (QID) | ORAL | Status: DC | PRN

## 2013-06-30 MED ORDER — ONDANSETRON 4 MG PO TBDP: 4.0000 mg | ORAL_TABLET | Freq: Three times a day (TID) | ORAL | Status: DC | PRN

## 2013-06-30 MED ORDER — CEFTRIAXONE SODIUM 1 G IJ SOLR
1.0000 g | Freq: Once | INTRAMUSCULAR | Status: AC
  Administered 2013-06-30: 1 g via INTRAMUSCULAR
  Filled 2013-06-30: qty 10

## 2013-06-30 NOTE — ED Notes (Signed)
C/o bladder pain, dysuria and left flank pain x 2 days

## 2013-06-30 NOTE — ED Provider Notes (Signed)
CSN: [id number]     Arrival date & time 06/30/13  1200 History   First MD Initiated Contact with Patient 06/30/13 1213     Chief Complaint  Patient presents with  . Dysuria    HPI  His history of UTIs. She's had dysuria and suprapubic discomfort and left flank pain for 2-3 days. Nausea and vomiting 2 nights ago and yesterday. None today. He seemed upset this morning. Minimal pain. Does have some pain in the flank but is tolerating it well.  Past Medical History  Diagnosis Date  . Frequent UTI    History reviewed. No pertinent past surgical history. No family history on file. History  Substance Use Topics  . Smoking status: Current Every Day Smoker -- 1.00 packs/day    Types: Cigarettes  . Smokeless tobacco: Never Used  . Alcohol Use: No   OB History   Grav Para Term Preterm Abortions TAB SAB Ect Mult Living   7 6 5 1      6      Review of Systems  Constitutional: Negative for fever, chills, diaphoresis, appetite change and fatigue.  HENT: Negative for mouth sores, sore throat and trouble swallowing.   Eyes: Negative for visual disturbance.  Respiratory: Negative for cough, chest tightness, shortness of breath and wheezing.   Cardiovascular: Negative for chest pain.  Gastrointestinal: Positive for nausea and vomiting. Negative for abdominal pain, diarrhea and abdominal distention.  Endocrine: Positive for polyuria. Negative for polydipsia and polyphagia.  Genitourinary: Positive for dysuria, urgency, frequency and flank pain. Negative for hematuria.  Musculoskeletal: Negative for gait problem.  Skin: Negative for color change, pallor and rash.  Neurological: Negative for dizziness, syncope, light-headedness and headaches.  Hematological: Does not bruise/bleed easily.  Psychiatric/Behavioral: Negative for behavioral problems and confusion.    Allergies  Review of patient's allergies indicates no known allergies.  Home Medications   Current Outpatient Rx  Name  Route   Sig  Dispense  Refill  . HYDROcodone-acetaminophen (HYCET) 7.5-325 mg/15 ml solution   Oral   Take 15 mLs by mouth 4 (four) times daily as needed for moderate pain.   120 mL   0   . ibuprofen (ADVIL,MOTRIN) 100 MG/5ML suspension      Take 600mg  every 6-8 hours as needed for inflammation and pain   473 mL   0   . ondansetron (ZOFRAN ODT) 4 MG disintegrating tablet   Oral   Take 1 tablet (4 mg total) by mouth every 8 (eight) hours as needed for nausea.   20 tablet   0   . sulfamethoxazole-trimethoprim (BACTRIM,SEPTRA) 200-40 MG/5ML suspension   Oral   Take 20 mLs by mouth 2 (two) times daily.   400 mL   0    BP 115/68  Pulse 90  Temp(Src) 97.7 F (36.5 C) (Oral)  Resp 20  SpO2 98%  Breastfeeding? No Physical Exam  Constitutional: She is oriented to person, place, and time. She appears well-developed and well-nourished. No distress.  HENT:  Head: Normocephalic.  Eyes: Conjunctivae are normal. Pupils are equal, round, and reactive to light. No scleral icterus.  Neck: Normal range of motion. Neck supple. No thyromegaly present.  Cardiovascular: Normal rate and regular rhythm.  Exam reveals no gallop and no friction rub.   No murmur heard. Pulmonary/Chest: Effort normal and breath sounds normal. No respiratory distress. She has no wheezes. She has no rales.  Abdominal: Soft. Bowel sounds are normal. She exhibits no distension. There is no tenderness. There is  no rebound.  Minimal suprapubic tenderness. Pain to percussion left flank.  Musculoskeletal: Normal range of motion.  Neurological: She is alert and oriented to person, place, and time.  Skin: Skin is warm and dry. No rash noted.  Psychiatric: She has a normal mood and affect. Her behavior is normal.    ED Course  Procedures (including critical care time) Labs Review Labs Reviewed  URINALYSIS, ROUTINE W REFLEX MICROSCOPIC - Abnormal; Notable for the following:    APPearance CLOUDY (*)    Hgb urine dipstick  MODERATE (*)    Leukocytes, UA MODERATE (*)    All other components within normal limits  URINE MICROSCOPIC-ADD ON - Abnormal; Notable for the following:    Bacteria, UA MANY (*)    All other components within normal limits  URINE CULTURE  PREGNANCY, URINE   Imaging Review No results found.  EKG Interpretation   None       MDM   1. Urinary tract infection    His history. Has some left flank pain. she's not precipitate toxic or febrile. Minimal pain, no nausea now. She declines IV. Declines pain meds and antiemetics. Given IM Rocephin. Discharged with oral Bactrim suspension, Zofran ODT's, Lortab elixir.    Roney Marion, MD 06/30/13 [phone]

## 2013-07-02 LAB — URINE CULTURE

## 2013-07-03 ENCOUNTER — Telehealth (HOSPITAL_COMMUNITY): Payer: Self-pay | Admitting: Emergency Medicine

## 2013-07-03 NOTE — ED Notes (Signed)
Post ED Visit - Positive Culture Follow-up  Culture report reviewed by antimicrobial stewardship pharmacist: []  Wes Dulaney, Pharm.D., BCPS []  Celedonio Miyamoto, Pharm.D., BCPS []  Georgina Pillion, Pharm.D., BCPS []  [place], [place].D., BCPS, AAHIVP []  Estella Husk, Pharm.D., BCPS, AAHIVP  Positive urine culture Treated with Sulfa-Trimeth, organism sensitive to the same and no further patient follow-up is required at this time.  Kylie A Holland 07/03/2013, 2:25 PM

## 2013-12-06 ENCOUNTER — Observation Stay (HOSPITAL_COMMUNITY): Payer: Medicaid - Out of State

## 2013-12-06 ENCOUNTER — Emergency Department (HOSPITAL_COMMUNITY): Payer: Medicaid - Out of State

## 2013-12-06 ENCOUNTER — Encounter (HOSPITAL_COMMUNITY): Payer: Self-pay | Admitting: Emergency Medicine

## 2013-12-06 ENCOUNTER — Inpatient Hospital Stay (HOSPITAL_COMMUNITY)
Admission: EM | Admit: 2013-12-06 | Discharge: 2013-12-11 | DRG: 493 | Disposition: A | Discharge status: Home / Self Care | Payer: Medicaid - Out of State | Attending: General Surgery | Admitting: General Surgery

## 2013-12-06 DIAGNOSIS — F1310 Sedative, hypnotic or anxiolytic abuse, uncomplicated: Secondary | ICD-10-CM | POA: Diagnosis present

## 2013-12-06 DIAGNOSIS — S42309A Unspecified fracture of shaft of humerus, unspecified arm, initial encounter for closed fracture: Secondary | ICD-10-CM

## 2013-12-06 DIAGNOSIS — D62 Acute posthemorrhagic anemia: Secondary | ICD-10-CM | POA: Diagnosis not present

## 2013-12-06 DIAGNOSIS — S270XXA Traumatic pneumothorax, initial encounter: Secondary | ICD-10-CM | POA: Diagnosis present

## 2013-12-06 DIAGNOSIS — S32009A Unspecified fracture of unspecified lumbar vertebra, initial encounter for closed fracture: Secondary | ICD-10-CM | POA: Diagnosis present

## 2013-12-06 DIAGNOSIS — S32019A Unspecified fracture of first lumbar vertebra, initial encounter for closed fracture: Secondary | ICD-10-CM | POA: Diagnosis present

## 2013-12-06 DIAGNOSIS — IMO0002 Reserved for concepts with insufficient information to code with codable children: Secondary | ICD-10-CM | POA: Diagnosis present

## 2013-12-06 DIAGNOSIS — S92001A Unspecified fracture of right calcaneus, initial encounter for closed fracture: Secondary | ICD-10-CM | POA: Diagnosis present

## 2013-12-06 DIAGNOSIS — Y92410 Unspecified street and highway as the place of occurrence of the external cause: Secondary | ICD-10-CM

## 2013-12-06 DIAGNOSIS — J982 Interstitial emphysema: Secondary | ICD-10-CM

## 2013-12-06 DIAGNOSIS — S27899A Unspecified injury of other specified intrathoracic organs, initial encounter: Secondary | ICD-10-CM

## 2013-12-06 DIAGNOSIS — J939 Pneumothorax, unspecified: Secondary | ICD-10-CM

## 2013-12-06 DIAGNOSIS — F17200 Nicotine dependence, unspecified, uncomplicated: Secondary | ICD-10-CM | POA: Diagnosis present

## 2013-12-06 DIAGNOSIS — S42301A Unspecified fracture of shaft of humerus, right arm, initial encounter for closed fracture: Secondary | ICD-10-CM | POA: Diagnosis present

## 2013-12-06 DIAGNOSIS — S92009A Unspecified fracture of unspecified calcaneus, initial encounter for closed fracture: Secondary | ICD-10-CM | POA: Diagnosis present

## 2013-12-06 DIAGNOSIS — S27892A Contusion of other specified intrathoracic organs, initial encounter: Secondary | ICD-10-CM

## 2013-12-06 DIAGNOSIS — S42213A Unspecified displaced fracture of surgical neck of unspecified humerus, initial encounter for closed fracture: Principal | ICD-10-CM | POA: Diagnosis present

## 2013-12-06 DIAGNOSIS — F1510 Other stimulant abuse, uncomplicated: Secondary | ICD-10-CM | POA: Diagnosis present

## 2013-12-06 LAB — CBC WITH DIFFERENTIAL/PLATELET
BASOS ABS: 0 10*3/uL (ref 0.0–0.1)
BASOS PCT: 0 % (ref 0–1)
EOS ABS: 0 10*3/uL (ref 0.0–0.7)
Eosinophils Relative: 0 % (ref 0–5)
HCT: 39.2 % (ref 36.0–46.0)
Hemoglobin: 13.5 g/dL (ref 12.0–15.0)
LYMPHS ABS: 2.1 10*3/uL (ref 0.7–4.0)
LYMPHS PCT: 8 % — AB (ref 12–46)
MCH: 30.8 pg (ref 26.0–34.0)
MCHC: 34.4 g/dL (ref 30.0–36.0)
MCV: 89.3 fL (ref 78.0–100.0)
Monocytes Absolute: 1.8 10*3/uL — ABNORMAL HIGH (ref 0.1–1.0)
Monocytes Relative: 7 % (ref 3–12)
Neutro Abs: 22.3 10*3/uL — ABNORMAL HIGH (ref 1.7–7.7)
Neutrophils Relative %: 85 % — ABNORMAL HIGH (ref 43–77)
Platelets: ADEQUATE 10*3/uL (ref 150–400)
RBC: 4.39 MIL/uL (ref 3.87–5.11)
RDW: 13.8 % (ref 11.5–15.5)
WBC: 26.2 10*3/uL — ABNORMAL HIGH (ref 4.0–10.5)

## 2013-12-06 LAB — COMPREHENSIVE METABOLIC PANEL
ALBUMIN: 3.7 g/dL (ref 3.5–5.2)
ALK PHOS: 121 U/L — AB (ref 39–117)
ALT: 35 U/L (ref 0–35)
AST: 42 U/L — ABNORMAL HIGH (ref 0–37)
BUN: 11 mg/dL (ref 6–23)
CALCIUM: 9.3 mg/dL (ref 8.4–10.5)
CO2: 22 mEq/L (ref 19–32)
CREATININE: 0.51 mg/dL (ref 0.50–1.10)
Chloride: 102 mEq/L (ref 96–112)
GFR calc non Af Amer: >90 mL/min (ref >90)
GLUCOSE: 104 mg/dL — AB (ref 70–99)
POTASSIUM: 3.5 meq/L — AB (ref 3.7–5.3)
Sodium: 138 mEq/L (ref 137–147)
TOTAL PROTEIN: 7 g/dL (ref 6.0–8.3)
Total Bilirubin: <0.2 mg/dL — ABNORMAL LOW (ref 0.3–1.2)

## 2013-12-06 LAB — I-STAT CHEM 8, ED
BUN: 11 mg/dL (ref 6–23)
CALCIUM ION: 1.1 mmol/L — AB (ref 1.12–1.23)
CREATININE: 0.6 mg/dL (ref 0.50–1.10)
Chloride: 103 mEq/L (ref 96–112)
Glucose, Bld: 107 mg/dL — ABNORMAL HIGH (ref 70–99)
HEMATOCRIT: 41 % (ref 36.0–46.0)
Hemoglobin: 13.9 g/dL (ref 12.0–15.0)
Potassium: 3.5 mEq/L — ABNORMAL LOW (ref 3.7–5.3)
Sodium: 139 mEq/L (ref 137–147)
TCO2: 25 mmol/L (ref 0–100)

## 2013-12-06 LAB — PREGNANCY, URINE: PREG TEST UR: NEGATIVE

## 2013-12-06 MED ORDER — ONDANSETRON HCL 4 MG PO TABS: 4.0000 mg | ORAL_TABLET | Freq: Four times a day (QID) | ORAL | Status: DC | PRN

## 2013-12-06 MED ORDER — KCL IN DEXTROSE-NACL 20-5-0.45 MEQ/L-%-% IV SOLN
INTRAVENOUS | Status: DC
  Administered 2013-12-06: 100 mL/h via INTRAVENOUS
  Filled 2013-12-06 (×4): qty 1000

## 2013-12-06 MED ORDER — FENTANYL CITRATE 0.05 MG/ML IJ SOLN
100.0000 ug | Freq: Once | INTRAMUSCULAR | Status: AC
  Administered 2013-12-06: 100 ug via INTRAVENOUS
  Filled 2013-12-06: qty 2

## 2013-12-06 MED ORDER — HYDROMORPHONE HCL PF 1 MG/ML IJ SOLN
1.0000 mg | Freq: Once | INTRAMUSCULAR | Status: DC
  Filled 2013-12-06: qty 1

## 2013-12-06 MED ORDER — IOHEXOL 300 MG/ML  SOLN
80.0000 mL | Freq: Once | INTRAMUSCULAR | Status: AC | PRN
  Administered 2013-12-06: 80 mL via INTRAVENOUS

## 2013-12-06 MED ORDER — OXYCODONE HCL 5 MG PO TABS: 5.0000 mg | ORAL_TABLET | ORAL | Status: DC | PRN

## 2013-12-06 MED ORDER — FENTANYL CITRATE 0.05 MG/ML IJ SOLN
100.0000 ug | Freq: Once | INTRAMUSCULAR | Status: AC
  Administered 2013-12-06: 50 ug via INTRAVENOUS
  Filled 2013-12-06: qty 2

## 2013-12-06 MED ORDER — OXYCODONE HCL 5 MG PO TABS: 10.0000 mg | ORAL_TABLET | ORAL | Status: DC | PRN

## 2013-12-06 MED ORDER — ENOXAPARIN SODIUM 40 MG/0.4ML ~~LOC~~ SOLN
40.0000 mg | SUBCUTANEOUS | Status: DC
Start: 2013-12-06 — End: 2013-12-11
  Administered 2013-12-07 – 2013-12-11 (×4): 40 mg via SUBCUTANEOUS
  Filled 2013-12-06 (×6): qty 0.4

## 2013-12-06 MED ORDER — FAMOTIDINE IN NACL 20-0.9 MG/50ML-% IV SOLN
20.0000 mg | INTRAVENOUS | Status: DC
  Administered 2013-12-06: 20 mg via INTRAVENOUS
  Filled 2013-12-06 (×2): qty 50

## 2013-12-06 MED ORDER — DIPHENHYDRAMINE HCL 25 MG PO CAPS: 25.0000 mg | ORAL_CAPSULE | Freq: Four times a day (QID) | ORAL | Status: DC | PRN

## 2013-12-06 MED ORDER — SODIUM CHLORIDE 0.9 % IV BOLUS (SEPSIS)
500.0000 mL | Freq: Once | INTRAVENOUS | Status: AC
  Administered 2013-12-06: 500 mL via INTRAVENOUS

## 2013-12-06 MED ORDER — ONDANSETRON HCL 4 MG/2ML IJ SOLN
4.0000 mg | Freq: Four times a day (QID) | INTRAMUSCULAR | Status: DC | PRN
  Filled 2013-12-06: qty 2

## 2013-12-06 MED ORDER — DOCUSATE SODIUM 100 MG PO CAPS
100.0000 mg | ORAL_CAPSULE | Freq: Two times a day (BID) | ORAL | Status: DC
  Administered 2013-12-06: 100 mg via ORAL
  Filled 2013-12-06 (×2): qty 1

## 2013-12-06 MED ORDER — HYDROMORPHONE HCL PF 1 MG/ML IJ SOLN: 1.0000 mg | INTRAMUSCULAR | Status: DC | PRN

## 2013-12-06 MED ORDER — MORPHINE SULFATE 2 MG/ML IJ SOLN
2.0000 mg | INTRAMUSCULAR | Status: DC | PRN
  Administered 2013-12-06 – 2013-12-07 (×8): 2 mg via INTRAVENOUS
  Filled 2013-12-06 (×3): qty 1
  Filled 2013-12-06: qty 2
  Filled 2013-12-06 (×4): qty 1

## 2013-12-06 NOTE — ED Notes (Signed)
Report given to [place], RN

## 2013-12-06 NOTE — ED Notes (Signed)
Jules Husbandsalled Hannah, PA-C about patient not able to void. Radiology is waiting for pregnancy test to come back negative before going off to x-ray.  Dahlia ClientHannah acknowledges, and orders and in and out cath.
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
## 2013-12-06 NOTE — ED Notes (Signed)
Per Bosie ClosJudith, urine for HCG has just been collected at [id number].
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
## 2013-12-06 NOTE — ED Notes (Signed)
Patient removed from spine board by PA

## 2013-12-06 NOTE — ED Notes (Signed)
U-preg resulted, pt currently in Xray
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
## 2013-12-06 NOTE — ED Notes (Signed)
Notified RN that the reason for CT delay is pending Upreg that was ordered. Orderding MD for CT marked yes to check HCG prior to scanning.

## 2013-12-06 NOTE — ED Notes (Signed)
Ortho to come splint

## 2013-12-06 NOTE — ED Notes (Signed)
Updated patient on plan of care, and assisted with lab draw for comfort.

## 2013-12-06 NOTE — ED Notes (Signed)
Patient has bruising on her hands, and right ankle has bruising and swelling.

## 2013-12-06 NOTE — Progress Notes (Signed)
Orthopedic Tech Progress Note Patient Details:  Melody Mayer Jun 01, 1986 [id number]  Ortho Devices Type of Ortho Device: Ace wrap;Watson Jones splint;Post (short leg) splint;Shoulder immobilizer Ortho Device/Splint Interventions: Application   Mickie BailJennifer Carol Cammer 12/06/2013, 1:45 PM

## 2013-12-06 NOTE — ED Notes (Signed)
Bruising noted on right medial ankle on inspection.

## 2013-12-06 NOTE — ED Notes (Signed)
Urine sent down to main lab for POC and put in for preg test to be done in main lab instead

## 2013-12-07 ENCOUNTER — Observation Stay (HOSPITAL_COMMUNITY): Payer: Medicaid - Out of State

## 2013-12-07 DIAGNOSIS — S42301A Unspecified fracture of shaft of humerus, right arm, initial encounter for closed fracture: Secondary | ICD-10-CM | POA: Diagnosis present

## 2013-12-07 DIAGNOSIS — S32019A Unspecified fracture of first lumbar vertebra, initial encounter for closed fracture: Secondary | ICD-10-CM | POA: Diagnosis present

## 2013-12-07 DIAGNOSIS — D62 Acute posthemorrhagic anemia: Secondary | ICD-10-CM | POA: Diagnosis not present

## 2013-12-07 DIAGNOSIS — S92001A Unspecified fracture of right calcaneus, initial encounter for closed fracture: Secondary | ICD-10-CM | POA: Diagnosis present

## 2013-12-07 LAB — CBC
HCT: 33 % — ABNORMAL LOW (ref 36.0–46.0)
Hemoglobin: 11 g/dL — ABNORMAL LOW (ref 12.0–15.0)
MCH: 30 pg (ref 26.0–34.0)
MCHC: 33.3 g/dL (ref 30.0–36.0)
MCV: 89.9 fL (ref 78.0–100.0)
PLATELETS: 230 10*3/uL (ref 150–400)
RBC: 3.67 MIL/uL — ABNORMAL LOW (ref 3.87–5.11)
RDW: 14.1 % (ref 11.5–15.5)
WBC: 9.6 10*3/uL (ref 4.0–10.5)

## 2013-12-07 LAB — BASIC METABOLIC PANEL
BUN: 4 mg/dL — ABNORMAL LOW (ref 6–23)
CHLORIDE: 102 meq/L (ref 96–112)
CO2: 26 mEq/L (ref 19–32)
CREATININE: 0.54 mg/dL (ref 0.50–1.10)
Calcium: 8.6 mg/dL (ref 8.4–10.5)
GFR calc Af Amer: >90 mL/min (ref >90)
Glucose, Bld: 98 mg/dL (ref 70–99)
POTASSIUM: 3.5 meq/L — AB (ref 3.7–5.3)
Sodium: 138 mEq/L (ref 137–147)

## 2013-12-07 LAB — SURGICAL PCR SCREEN
MRSA, PCR: NEGATIVE
Staphylococcus aureus: NEGATIVE

## 2013-12-07 MED ORDER — MORPHINE SULFATE 2 MG/ML IJ SOLN
2.0000 mg | INTRAMUSCULAR | Status: DC | PRN
  Administered 2013-12-07 – 2013-12-11 (×13): 2 mg via INTRAVENOUS
  Filled 2013-12-07 (×14): qty 1

## 2013-12-07 MED ORDER — METHOCARBAMOL 100 MG/ML IJ SOLN
500.0000 mg | Freq: Four times a day (QID) | INTRAVENOUS | Status: DC | PRN
  Filled 2013-12-07 (×2): qty 10

## 2013-12-07 MED ORDER — TRAMADOL HCL 50 MG PO TABS
50.0000 mg | ORAL_TABLET | Freq: Four times a day (QID) | ORAL | Status: DC | PRN
  Administered 2013-12-07: 100 mg via ORAL
  Administered 2013-12-08: 50 mg via ORAL
  Filled 2013-12-07: qty 2
  Filled 2013-12-07: qty 1

## 2013-12-07 MED ORDER — POLYETHYLENE GLYCOL 3350 17 G PO PACK
17.0000 g | PACK | Freq: Every day | ORAL | Status: DC
  Administered 2013-12-07 – 2013-12-10 (×3): 17 g via ORAL
  Filled 2013-12-07 (×7): qty 1

## 2013-12-07 MED ORDER — DOCUSATE SODIUM 50 MG/5ML PO LIQD
100.0000 mg | Freq: Two times a day (BID) | ORAL | Status: DC
  Administered 2013-12-07: 100 mg via ORAL
  Filled 2013-12-07 (×4): qty 10

## 2013-12-07 MED ORDER — CEFAZOLIN SODIUM-DEXTROSE 2-3 GM-% IV SOLR
2.0000 g | INTRAVENOUS | Status: DC
  Filled 2013-12-07: qty 50

## 2013-12-07 NOTE — Progress Notes (Signed)
UR completed 

## 2013-12-07 NOTE — Progress Notes (Signed)
Appreciate Dr. Magdalene PatriciaHandy's care. Will have NS eval the L1 FX. [facility]. I also spoke with her family. Patient examined and I agree with the assessment and plan  Violeta GelinasBurke Karen Huhta, MD, MPH, FACS Trauma: [phone] General Surgery: [phone]  12/07/2013 9:53 AM

## 2013-12-07 NOTE — Progress Notes (Signed)
Physical Therapy Note  Physical Therapy has reviewed pt chart and received PT eval/treat orders. At this time we will await report from neuro before mobilizing secondary to L1 fracture. Will have nurse notify PT when patient is cleared with appropriate precautions deemed fit by neuro surgeon.  [address] Sandy OaksBar[place], South CarolinaPT [phone]
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
## 2013-12-07 NOTE — ED Provider Notes (Signed)
Medical screening examination/treatment/procedure(s) were performed by non-physician practitioner and as supervising physician I was immediately available for consultation/collaboration.   EKG Interpretation None       Sunnie NielsenBrian Natlie Asfour, MD 12/07/13 [phone]
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
## 2013-12-08 ENCOUNTER — Encounter (HOSPITAL_COMMUNITY): Admission: EM | Disposition: A | Discharge status: Home / Self Care | Payer: Self-pay | Source: Home / Self Care

## 2013-12-08 ENCOUNTER — Inpatient Hospital Stay (HOSPITAL_COMMUNITY): Payer: Medicaid - Out of State | Admitting: Anesthesiology

## 2013-12-08 ENCOUNTER — Inpatient Hospital Stay (HOSPITAL_COMMUNITY): Payer: Medicaid - Out of State

## 2013-12-08 ENCOUNTER — Encounter (HOSPITAL_COMMUNITY): Payer: Medicaid - Out of State | Admitting: Anesthesiology

## 2013-12-08 ENCOUNTER — Encounter (HOSPITAL_COMMUNITY): Payer: Self-pay | Admitting: Anesthesiology

## 2013-12-08 HISTORY — PX: ORIF HUMERUS FRACTURE: SHX2126

## 2013-12-08 LAB — CBC
HCT: 34.3 % — ABNORMAL LOW (ref 36.0–46.0)
Hemoglobin: 11.4 g/dL — ABNORMAL LOW (ref 12.0–15.0)
MCH: 30 pg (ref 26.0–34.0)
MCHC: 33.2 g/dL (ref 30.0–36.0)
MCV: 90.3 fL (ref 78.0–100.0)
PLATELETS: 216 10*3/uL (ref 150–400)
RBC: 3.8 MIL/uL — AB (ref 3.87–5.11)
RDW: 13.6 % (ref 11.5–15.5)
WBC: 10.5 10*3/uL (ref 4.0–10.5)

## 2013-12-08 LAB — RAPID URINE DRUG SCREEN, HOSP PERFORMED
Amphetamines: POSITIVE — AB
BARBITURATES: NOT DETECTED
Benzodiazepines: POSITIVE — AB
COCAINE: NOT DETECTED
Opiates: POSITIVE — AB
TETRAHYDROCANNABINOL: NOT DETECTED

## 2013-12-08 SURGERY — OPEN REDUCTION INTERNAL FIXATION (ORIF) PROXIMAL HUMERUS FRACTURE
Anesthesia: General | Site: Arm Upper | Laterality: Right

## 2013-12-08 MED ORDER — SUCCINYLCHOLINE CHLORIDE 20 MG/ML IJ SOLN
INTRAMUSCULAR | Status: AC
  Filled 2013-12-08: qty 1

## 2013-12-08 MED ORDER — MIDAZOLAM HCL 5 MG/5ML IJ SOLN
INTRAMUSCULAR | Status: DC | PRN
  Administered 2013-12-08: 2 mg via INTRAVENOUS

## 2013-12-08 MED ORDER — ONDANSETRON HCL 4 MG/2ML IJ SOLN: 4.0000 mg | Freq: Once | INTRAMUSCULAR | Status: DC | PRN

## 2013-12-08 MED ORDER — PROPOFOL 10 MG/ML IV BOLUS
INTRAVENOUS | Status: AC
  Filled 2013-12-08: qty 20

## 2013-12-08 MED ORDER — CEFAZOLIN SODIUM-DEXTROSE 2-3 GM-% IV SOLR
INTRAVENOUS | Status: DC | PRN
  Administered 2013-12-08: 2 g via INTRAVENOUS

## 2013-12-08 MED ORDER — LIDOCAINE HCL (CARDIAC) 20 MG/ML IV SOLN
INTRAVENOUS | Status: AC
  Filled 2013-12-08: qty 10

## 2013-12-08 MED ORDER — CEFAZOLIN SODIUM 1-5 GM-% IV SOLN
1.0000 g | Freq: Four times a day (QID) | INTRAVENOUS | Status: DC
  Administered 2013-12-08 – 2013-12-09 (×2): 1 g via INTRAVENOUS
  Filled 2013-12-08 (×3): qty 50

## 2013-12-08 MED ORDER — ONDANSETRON HCL 4 MG PO TABS: 4.0000 mg | ORAL_TABLET | Freq: Four times a day (QID) | ORAL | Status: DC | PRN

## 2013-12-08 MED ORDER — MAGNESIUM CITRATE PO SOLN: 1.0000 | Freq: Once | ORAL | Status: AC | PRN

## 2013-12-08 MED ORDER — ROCURONIUM BROMIDE 100 MG/10ML IV SOLN
INTRAVENOUS | Status: DC | PRN
  Administered 2013-12-08: 10 mg via INTRAVENOUS
  Administered 2013-12-08: 30 mg via INTRAVENOUS

## 2013-12-08 MED ORDER — BISACODYL 10 MG RE SUPP: 10.0000 mg | Freq: Every day | RECTAL | Status: DC | PRN

## 2013-12-08 MED ORDER — FENTANYL CITRATE 0.05 MG/ML IJ SOLN
INTRAMUSCULAR | Status: DC | PRN
  Administered 2013-12-08: 75 ug via INTRAVENOUS
  Administered 2013-12-08 (×3): 50 ug via INTRAVENOUS
  Administered 2013-12-08: 125 ug via INTRAVENOUS

## 2013-12-08 MED ORDER — HYDROMORPHONE HCL PF 1 MG/ML IJ SOLN
INTRAMUSCULAR | Status: AC
  Administered 2013-12-08: 0.5 mg via INTRAVENOUS
  Filled 2013-12-08: qty 1

## 2013-12-08 MED ORDER — ROCURONIUM BROMIDE 50 MG/5ML IV SOLN
INTRAVENOUS | Status: AC
  Filled 2013-12-08: qty 1

## 2013-12-08 MED ORDER — MENTHOL 3 MG MT LOZG: 1.0000 | LOZENGE | OROMUCOSAL | Status: DC | PRN

## 2013-12-08 MED ORDER — PROPOFOL 10 MG/ML IV BOLUS
INTRAVENOUS | Status: DC | PRN
  Administered 2013-12-08: 200 mg via INTRAVENOUS

## 2013-12-08 MED ORDER — PHENOL 1.4 % MT LIQD: 1.0000 | OROMUCOSAL | Status: DC | PRN

## 2013-12-08 MED ORDER — SUCCINYLCHOLINE CHLORIDE 20 MG/ML IJ SOLN
INTRAMUSCULAR | Status: DC | PRN
  Administered 2013-12-08: 100 mg via INTRAVENOUS

## 2013-12-08 MED ORDER — ARTIFICIAL TEARS OP OINT
TOPICAL_OINTMENT | OPHTHALMIC | Status: DC | PRN
  Administered 2013-12-08: 1 via OPHTHALMIC

## 2013-12-08 MED ORDER — HYDROMORPHONE HCL PF 1 MG/ML IJ SOLN
INTRAMUSCULAR | Status: AC
  Filled 2013-12-08: qty 1

## 2013-12-08 MED ORDER — LIDOCAINE HCL (CARDIAC) 20 MG/ML IV SOLN
INTRAVENOUS | Status: DC | PRN
  Administered 2013-12-08: 100 mg via INTRAVENOUS

## 2013-12-08 MED ORDER — ONDANSETRON HCL 4 MG/2ML IJ SOLN: 4.0000 mg | Freq: Four times a day (QID) | INTRAMUSCULAR | Status: DC | PRN

## 2013-12-08 MED ORDER — HYDROMORPHONE HCL PF 1 MG/ML IJ SOLN
2.0000 mg | Freq: Once | INTRAMUSCULAR | Status: DC
Start: 2013-12-08 — End: 2013-12-09

## 2013-12-08 MED ORDER — ONDANSETRON HCL 4 MG/2ML IJ SOLN
INTRAMUSCULAR | Status: DC | PRN
  Administered 2013-12-08: 4 mg via INTRAVENOUS

## 2013-12-08 MED ORDER — LACTATED RINGERS IV SOLN
INTRAVENOUS | Status: DC
  Administered 2013-12-08: 12:00:00 via INTRAVENOUS
  Administered 2013-12-09: 20 mL/h via INTRAVENOUS

## 2013-12-08 MED ORDER — LIDOCAINE HCL 4 % MT SOLN
OROMUCOSAL | Status: DC | PRN
  Administered 2013-12-08: 4 mL via TOPICAL

## 2013-12-08 MED ORDER — 0.9 % SODIUM CHLORIDE (POUR BTL) OPTIME
TOPICAL | Status: DC | PRN
  Administered 2013-12-08: 1000 mL

## 2013-12-08 MED ORDER — ALUM & MAG HYDROXIDE-SIMETH 200-200-20 MG/5ML PO SUSP: 30.0000 mL | ORAL | Status: DC | PRN

## 2013-12-08 MED ORDER — ACETAMINOPHEN 325 MG PO TABS: 650.0000 mg | ORAL_TABLET | Freq: Four times a day (QID) | ORAL | Status: DC | PRN

## 2013-12-08 MED ORDER — NEOSTIGMINE METHYLSULFATE 1 MG/ML IJ SOLN
INTRAMUSCULAR | Status: DC | PRN
  Administered 2013-12-08: 3 mg via INTRAVENOUS

## 2013-12-08 MED ORDER — ARTIFICIAL TEARS OP OINT
TOPICAL_OINTMENT | OPHTHALMIC | Status: AC
  Filled 2013-12-08: qty 3.5

## 2013-12-08 MED ORDER — DOCUSATE SODIUM 50 MG/5ML PO LIQD
100.0000 mg | Freq: Two times a day (BID) | ORAL | Status: DC
  Administered 2013-12-08 – 2013-12-10 (×5): 100 mg via ORAL
  Filled 2013-12-08 (×7): qty 10

## 2013-12-08 MED ORDER — GLYCOPYRROLATE 0.2 MG/ML IJ SOLN
INTRAMUSCULAR | Status: DC | PRN
  Administered 2013-12-08: .5 mg via INTRAVENOUS

## 2013-12-08 MED ORDER — MIDAZOLAM HCL 2 MG/2ML IJ SOLN
INTRAMUSCULAR | Status: AC
  Filled 2013-12-08: qty 2

## 2013-12-08 MED ORDER — ONDANSETRON HCL 4 MG/2ML IJ SOLN
INTRAMUSCULAR | Status: AC
  Filled 2013-12-08: qty 2

## 2013-12-08 MED ORDER — HYDROMORPHONE HCL PF 1 MG/ML IJ SOLN
0.2500 mg | INTRAMUSCULAR | Status: DC | PRN
  Administered 2013-12-08 (×2): 0.5 mg via INTRAVENOUS

## 2013-12-08 MED ORDER — LACTATED RINGERS IV SOLN
INTRAVENOUS | Status: DC | PRN
  Administered 2013-12-08 (×2): via INTRAVENOUS

## 2013-12-08 MED ORDER — NEOSTIGMINE METHYLSULFATE 1 MG/ML IJ SOLN
INTRAMUSCULAR | Status: AC
  Filled 2013-12-08: qty 10

## 2013-12-08 MED ORDER — ACETAMINOPHEN 650 MG RE SUPP: 650.0000 mg | Freq: Four times a day (QID) | RECTAL | Status: DC | PRN

## 2013-12-08 MED ORDER — METOCLOPRAMIDE HCL 5 MG/ML IJ SOLN: 5.0000 mg | Freq: Three times a day (TID) | INTRAMUSCULAR | Status: DC | PRN

## 2013-12-08 MED ORDER — DOCUSATE SODIUM 100 MG PO CAPS: 100.0000 mg | ORAL_CAPSULE | Freq: Two times a day (BID) | ORAL | Status: DC

## 2013-12-08 MED ORDER — GLYCOPYRROLATE 0.2 MG/ML IJ SOLN
INTRAMUSCULAR | Status: AC
  Filled 2013-12-08: qty 2

## 2013-12-08 MED ORDER — MAGNESIUM HYDROXIDE 400 MG/5ML PO SUSP: 30.0000 mL | Freq: Every day | ORAL | Status: DC | PRN

## 2013-12-08 MED ORDER — FENTANYL CITRATE 0.05 MG/ML IJ SOLN
INTRAMUSCULAR | Status: AC
  Filled 2013-12-08: qty 5

## 2013-12-08 MED ORDER — METOCLOPRAMIDE HCL 10 MG PO TABS: 5.0000 mg | ORAL_TABLET | Freq: Three times a day (TID) | ORAL | Status: DC | PRN

## 2013-12-08 SURGICAL SUPPLY — 77 items
BANDAGE GAUZE ELAST BULKY 4 IN (GAUZE/BANDAGES/DRESSINGS) IMPLANT
BENZOIN TINCTURE PRP APPL 2/3 (GAUZE/BANDAGES/DRESSINGS) IMPLANT
BIT DRILL 4 LONG FAST STEP (BIT) ×3 IMPLANT
BIT DRILL 4 SHORT FAST STEP (BIT) ×3 IMPLANT
BRUSH SCRUB DISP (MISCELLANEOUS) ×6 IMPLANT
COVER SURGICAL LIGHT HANDLE (MISCELLANEOUS) ×3 IMPLANT
DRAPE C-ARM 42X72 X-RAY (DRAPES) ×3 IMPLANT
DRAPE C-ARMOR (DRAPES) IMPLANT
DRAPE INCISE IOBAN 66X45 STRL (DRAPES) ×3 IMPLANT
DRAPE ORTHO SPLIT 77X108 STRL (DRAPES) ×4
DRAPE SURG 17X11 SM STRL (DRAPES) IMPLANT
DRAPE SURG ORHT 6 SPLT 77X108 (DRAPES) ×2 IMPLANT
DRAPE U-SHAPE 47X51 STRL (DRAPES) ×3 IMPLANT
DRILL BIT 2.8MM DB28 (MISCELLANEOUS) ×3 IMPLANT
DRSG ADAPTIC 3X8 NADH LF (GAUZE/BANDAGES/DRESSINGS) IMPLANT
DRSG MEPILEX BORDER 4X8 (GAUZE/BANDAGES/DRESSINGS) ×3 IMPLANT
DRSG PAD ABDOMINAL 8X10 ST (GAUZE/BANDAGES/DRESSINGS) IMPLANT
ELECT REM PT RETURN 9FT ADLT (ELECTROSURGICAL) ×3
ELECTRODE REM PT RTRN 9FT ADLT (ELECTROSURGICAL) ×1 IMPLANT
EVACUATOR 1/8 PVC DRAIN (DRAIN) IMPLANT
GLOVE BIO SURGEON STRL SZ7.5 (GLOVE) ×6 IMPLANT
GLOVE BIO SURGEON STRL SZ8 (GLOVE) ×6 IMPLANT
GLOVE BIOGEL PI IND STRL 7.5 (GLOVE) ×1 IMPLANT
GLOVE BIOGEL PI IND STRL 8 (GLOVE) ×1 IMPLANT
GLOVE BIOGEL PI INDICATOR 7.5 (GLOVE) ×2
GLOVE BIOGEL PI INDICATOR 8 (GLOVE) ×2
GOWN STRL REUS W/ TWL LRG LVL3 (GOWN DISPOSABLE) ×2 IMPLANT
GOWN STRL REUS W/ TWL XL LVL3 (GOWN DISPOSABLE) ×1 IMPLANT
GOWN STRL REUS W/TWL 2XL LVL3 (GOWN DISPOSABLE) ×3 IMPLANT
GOWN STRL REUS W/TWL LRG LVL3 (GOWN DISPOSABLE) ×4
GOWN STRL REUS W/TWL XL LVL3 (GOWN DISPOSABLE) ×2
KIT BASIN OR (CUSTOM PROCEDURE TRAY) ×3 IMPLANT
KIT ROOM TURNOVER OR (KITS) ×3 IMPLANT
LOOP VESSEL MAXI BLUE (MISCELLANEOUS) IMPLANT
MANIFOLD NEPTUNE II (INSTRUMENTS) IMPLANT
NS IRRIG 1000ML POUR BTL (IV SOLUTION) ×3 IMPLANT
PACK TOTAL JOINT (CUSTOM PROCEDURE TRAY) ×3 IMPLANT
PAD ARMBOARD 7.5X6 YLW CONV (MISCELLANEOUS) ×6 IMPLANT
PEG STND 4.0X30MM (Orthopedic Implant) ×3 IMPLANT
PEG STND 4.0X35MM (Orthopedic Implant) ×9 IMPLANT
PEG STND 4.0X37.5MM (Orthopedic Implant) ×3 IMPLANT
PEG STND 4.0X40MM (Orthopedic Implant) ×6 IMPLANT
PEG STND 4.0X45.0MM (Orthopedic Implant) ×3 IMPLANT
PEG STND 4.0X47.5MM (Orthopedic Implant) ×3 IMPLANT
PEGSTD 4.0X30MM (Orthopedic Implant) ×1 IMPLANT
PEGSTD 4.0X35MM (Orthopedic Implant) ×3 IMPLANT
PEGSTD 4.0X37.5MM (Orthopedic Implant) ×1 IMPLANT
PEGSTD 4.0X40MM (Orthopedic Implant) ×2 IMPLANT
PEGSTD 4.0X45.0MM (Orthopedic Implant) ×1 IMPLANT
PEGSTD 4.0X47.5MM (Orthopedic Implant) ×1 IMPLANT
PIN GUIDE SHOULDER 2.0MM (PIN) ×6 IMPLANT
PLATE SHOULDER S3 4HOLE RT (Plate) ×3 IMPLANT
SCREW MULTIDIR 3.8X24 HUMRL (Screw) ×3 IMPLANT
SCREW MULTIDIR SS 3.8X28 HUMRL (Screw) ×3 IMPLANT
SLING ARM FOAM STRAP LRG (SOFTGOODS) ×3 IMPLANT
SPONGE GAUZE 4X4 12PLY (GAUZE/BANDAGES/DRESSINGS) IMPLANT
SPONGE LAP 18X18 X RAY DECT (DISPOSABLE) IMPLANT
STAPLER VISISTAT 35W (STAPLE) ×3 IMPLANT
STOCKINETTE IMPERVIOUS LG (DRAPES) ×3 IMPLANT
SUCTION FRAZIER TIP 10 FR DISP (SUCTIONS) ×3 IMPLANT
SUT BONE WAX W31G (SUTURE) ×6 IMPLANT
SUT ETHIBOND 5 LR DA (SUTURE) IMPLANT
SUT ETHILON 3 0 PS 1 (SUTURE) ×3 IMPLANT
SUT FIBERWIRE #2 38 T-5 BLUE (SUTURE)
SUT PDS AB 2-0 CT1 27 (SUTURE) IMPLANT
SUT VIC AB 0 CT1 27 (SUTURE) ×2
SUT VIC AB 0 CT1 27XBRD ANBCTR (SUTURE) ×1 IMPLANT
SUT VIC AB 2-0 CT1 27 (SUTURE) ×2
SUT VIC AB 2-0 CT1 TAPERPNT 27 (SUTURE) ×1 IMPLANT
SUT VIC AB 2-0 CT3 27 (SUTURE) IMPLANT
SUTURE FIBERWR #2 38 T-5 BLUE (SUTURE) IMPLANT
SYR 5ML LL (SYRINGE) IMPLANT
TOWEL OR 17X24 6PK STRL BLUE (TOWEL DISPOSABLE) ×3 IMPLANT
TOWEL OR 17X26 10 PK STRL BLUE (TOWEL DISPOSABLE) ×6 IMPLANT
TRAY FOLEY CATH 16FRSI W/METER (SET/KITS/TRAYS/PACK) ×3 IMPLANT
WATER STERILE IRR 1000ML POUR (IV SOLUTION) IMPLANT
YANKAUER SUCT BULB TIP NO VENT (SUCTIONS) IMPLANT

## 2013-12-08 NOTE — Progress Notes (Signed)
Care of pt assumed by MA Camar Guyton RN 
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
## 2013-12-08 NOTE — Progress Notes (Signed)
Orthopedic Tech Progress Note Patient Details:  Nolen MuRobin Hemrick 10/26/1985 [id number] Splint removal and reapplication Ortho Devices Type of Ortho Device: Post (short leg) splint Ortho Device/Splint Location: Right LE Ortho Device/Splint Interventions: Removal;Adjustment;Application   Asia R Thompson 12/08/2013, 10:00 AM

## 2013-12-08 NOTE — Transfer of Care (Signed)
Immediate Anesthesia Transfer of Care Note  Patient: Melody Mayer  Procedure(s) Performed: Procedure(s): OPEN REDUCTION INTERNAL FIXATION (ORIF) PROXIMAL HUMERUS FRACTURE (Right)  Patient Location: PACU  Anesthesia Type:General  Level of Consciousness: awake, alert , oriented and sedated  Airway & Oxygen Therapy: Patient Spontanous Breathing and Patient connected to nasal cannula oxygen  Post-op Assessment: Report given to PACU RN, Post -op Vital signs reviewed and stable and Patient moving all extremities  Post vital signs: Reviewed and stable  Complications: No apparent anesthesia complications

## 2013-12-08 NOTE — Anesthesia Postprocedure Evaluation (Signed)
  Anesthesia Post-op Note  Patient: Melody Mayer  Procedure(s) Performed: Procedure(s): OPEN REDUCTION INTERNAL FIXATION (ORIF) PROXIMAL HUMERUS FRACTURE (Right)  Patient Location: PACU  Anesthesia Type:General  Level of Consciousness: awake, oriented, sedated and patient cooperative  Airway and Oxygen Therapy: Patient Spontanous Breathing  Post-op Pain: mild  Post-op Assessment: Post-op Vital signs reviewed, Patient's Cardiovascular Status Stable, Respiratory Function Stable, Patent Airway, No signs of Nausea or vomiting and Pain level controlled  Post-op Vital Signs: stable  Last Vitals:  Filed Vitals:   12/08/13 1442  BP: 110/57  Pulse: 87  Temp: 36.8 C  Resp: 18    Complications: No apparent anesthesia complications

## 2013-12-08 NOTE — Brief Op Note (Signed)
12/06/2013 - 12/08/2013  2:22 PM  PATIENT:  Melody Mayer  28 y.o. female  PRE-OPERATIVE DIAGNOSIS:   1. RIGHT PROXIMAL HUMERUS FX 2. RIGHT CALCANEUS FRACTURE  POST-OPERATIVE DIAGNOSIS:   1. RIGHT PROXIMAL HUMERUS FX 2. RIGHT CALCANEUS FRACTURE  PROCEDURE:  Procedure(s): 1. OPEN REDUCTION INTERNAL FIXATION (ORIF) PROXIMAL HUMERUS FRACTURE (Right) 2. APPLICATION OF BULKY JONES SHORT LEG SPLINT  SURGEON:  Surgeon(s) and Role:    * Budd PalmerMichael H Hartlee Amedee, MD - Primary  PHYSICIAN ASSISTANT: Montez MoritaKeith Paul, PA-C  ANESTHESIA:   general  I/O:  Total I/O In: 1300 [I.V.:1300] Out: 675 [Urine:575; Blood:100]  SPECIMEN:  No Specimen  TOURNIQUET:  * No tourniquets in log *  DICTATION: .Other Dictation: Dictation Number [id number]

## 2013-12-08 NOTE — Consult Note (Signed)
I have seen and examined the patient. I agree with the findings above.  RUEx shoulder tender and swollen; elbow, wrist, digits- no skin wounds, nontender, no instability, no blocks to motion  Sens  Ax/R/M/U intact  Mot   R/ PIN/ M/ AIN/ U intact  Rad 2+ RLE No traumatic wounds, ecchymosis, or rash  Tender and swollen foot  No effusions  Knee stable to varus/ valgus and anterior/posterior stress  Sens DPN, SPN, TN intact  Motor EHL, ext, flex digits intact  Brisk CR   I discussed with the patient the risks and benefits of surgery, including the possibility of infection for R prox humerus repair, nerve injury, vessel injury, wound breakdown, arthritis, symptomatic hardware, DVT/ PE, loss of motion, and need for further surgery among others.  She understood these risks and wished to proceed.    Budd PalmerMichael H Ambar Raphael, MD 12/08/2013 11:05 AM

## 2013-12-08 NOTE — Addendum Note (Signed)
Addendum created 12/08/13 1500 by Fransisca KaufmannMary E Jvon Meroney, CRNA   Modules edited: Anesthesia Medication Administration

## 2013-12-08 NOTE — Progress Notes (Signed)
I have seen and examined the pt and agree with NP-Riebock's progress note. OR today

## 2013-12-08 NOTE — Anesthesia Procedure Notes (Signed)
Procedure Name: Intubation Date/Time: 12/08/2013 11:55 AM Performed by: Fransisca KaufmannMEYER, Iszabella Hebenstreit E Pre-anesthesia Checklist: Patient identified, Suction available, Emergency Drugs available, Patient being monitored and Timeout performed Patient Re-evaluated:Patient Re-evaluated prior to inductionOxygen Delivery Method: Circle system utilized Preoxygenation: Pre-oxygenation with 100% oxygen Intubation Type: IV induction Ventilation: Mask ventilation without difficulty Laryngoscope Size: 2 and Miller Grade View: Grade I Tube type: Oral Tube size: 7.5 mm Number of attempts: 1 Airway Equipment and Method: Stylet Placement Confirmation: ETT inserted through vocal cords under direct vision,  positive ETCO2 and breath sounds checked- equal and bilateral Secured at: 21 cm Tube secured with: Tape Dental Injury: Teeth and Oropharynx as per pre-operative assessment  Comments: Poor dentition/unchanged after DL and intubation

## 2013-12-08 NOTE — Progress Notes (Signed)
PT Cancellation Note  Patient Details Name: Melody Mayer MRN: [id number] DOB: February 01, 1986   Cancelled Treatment:    Reason Eval/Treat Not Completed: Patient not medically ready.  Pt to OR today for Humerus ORIF.  Will f/u tomorrow.     Melody Mayer 12/08/2013, 9:12 AM

## 2013-12-08 NOTE — Anesthesia Preprocedure Evaluation (Signed)
Anesthesia Evaluation  Patient identified by MRN, date of birth, ID band Patient awake    Reviewed: Allergy & Precautions, H&P , Patient's Chart, lab work & pertinent test results  Airway Mallampati: I  Neck ROM: full    Dental  (+) Poor Dentition, Dental Advidsory Given   Pulmonary Current Smoker,          Cardiovascular negative cardio ROS  Rhythm:regular     Neuro/Psych negative neurological ROS  negative psych ROS   GI/Hepatic negative GI ROS, Neg liver ROS,   Endo/Other  negative endocrine ROS  Renal/GU negative Renal ROS  Female GU complaint     Musculoskeletal   Abdominal   Peds  Hematology   Anesthesia Other Findings   Reproductive/Obstetrics negative OB ROS                           Anesthesia Physical Anesthesia Plan  ASA: II  Anesthesia Plan: General ETT   Post-op Pain Management:    Induction:   Airway Management Planned:   Additional Equipment:   Intra-op Plan:   Post-operative Plan:   Informed Consent:   Dental Advisory Given  Plan Discussed with: Anesthesiologist, CRNA and Surgeon  Anesthesia Plan Comments:         Anesthesia Quick Evaluation

## 2013-12-08 NOTE — Progress Notes (Signed)
OT Cancellation Note  Patient Details Name: Melody Mayer MRN: [id number] DOB: 07-01-1986   Cancelled Treatment:    Reason Eval/Treat Not Completed: Other (comment) (Patient not medically ready.  Pt to OR today for Humerus ORIF.) Will follow up tomorrow.  Earlie RavelingLindsey L Kinzlie Harney OTR/L [phone] 12/08/2013, 10:15 AM

## 2013-12-08 NOTE — Addendum Note (Signed)
Addendum created 12/08/13 1508 by Laverle HobbyGregory Mayleen Borrero, MD   Modules edited: Orders, PRL Based Order Sets
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
## 2013-12-09 LAB — BASIC METABOLIC PANEL
BUN: 5 mg/dL — ABNORMAL LOW (ref 6–23)
CHLORIDE: 96 meq/L (ref 96–112)
CO2: 29 mEq/L (ref 19–32)
Calcium: 9 mg/dL (ref 8.4–10.5)
Creatinine, Ser: 0.57 mg/dL (ref 0.50–1.10)
Glucose, Bld: 134 mg/dL — ABNORMAL HIGH (ref 70–99)
POTASSIUM: 3.8 meq/L (ref 3.7–5.3)
Sodium: 138 mEq/L (ref 137–147)

## 2013-12-09 LAB — CBC
HCT: 33.7 % — ABNORMAL LOW (ref 36.0–46.0)
Hemoglobin: 11.4 g/dL — ABNORMAL LOW (ref 12.0–15.0)
MCH: 30.2 pg (ref 26.0–34.0)
MCHC: 33.8 g/dL (ref 30.0–36.0)
MCV: 89.4 fL (ref 78.0–100.0)
Platelets: 227 10*3/uL (ref 150–400)
RBC: 3.77 MIL/uL — ABNORMAL LOW (ref 3.87–5.11)
RDW: 13.5 % (ref 11.5–15.5)
WBC: 12.3 10*3/uL — AB (ref 4.0–10.5)

## 2013-12-09 MED ORDER — TRAMADOL HCL 50 MG PO TABS
50.0000 mg | ORAL_TABLET | Freq: Four times a day (QID) | ORAL | Status: DC | PRN
  Administered 2013-12-09: 100 mg via ORAL
  Administered 2013-12-09: 50 mg via ORAL
  Administered 2013-12-10 – 2013-12-11 (×2): 100 mg via ORAL
  Administered 2013-12-11 (×2): 50 mg via ORAL
  Filled 2013-12-09 (×2): qty 2
  Filled 2013-12-09: qty 1
  Filled 2013-12-09 (×2): qty 2
  Filled 2013-12-09 (×2): qty 1

## 2013-12-09 MED ORDER — HYDROCODONE-ACETAMINOPHEN 7.5-325 MG/15ML PO SOLN: 10.0000 mL | ORAL | Status: DC | PRN

## 2013-12-09 MED ORDER — HYDROCODONE-ACETAMINOPHEN 7.5-325 MG/15ML PO SOLN
10.0000 mL | ORAL | Status: DC | PRN
  Filled 2013-12-09: qty 30

## 2013-12-09 MED ORDER — HYDROMORPHONE HCL PF 1 MG/ML IJ SOLN
2.0000 mg | Freq: Once | INTRAMUSCULAR | Status: AC
  Administered 2013-12-09: 2 mg via INTRAVENOUS
  Filled 2013-12-09: qty 2
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
## 2013-12-09 NOTE — Progress Notes (Signed)
Physical Therapy Note  Evaluation attempted by physical therapy this afternoon. Pt unwilling to participate at this time due to "dizziness" that she reports is due to her pain medication. Physical therapy will attempt to see patient again this afternoon as time permits and if pt is willing.  [address]Melody Mayer [place], [place] [phone]
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
## 2013-12-09 NOTE — Progress Notes (Signed)
Patient asleep, seems comfortable.  CPM.  This patient has been seen and I agree with the findings and treatment plan.  Marta LamasJames O. Gae BonWyatt, III, MD, FACS [phone] (pager) [phone] (direct pager) Trauma Surgeon

## 2013-12-10 ENCOUNTER — Encounter (HOSPITAL_COMMUNITY): Payer: Self-pay | Admitting: Orthopedic Surgery

## 2013-12-10 NOTE — Progress Notes (Signed)
Patient is very sure she has [place] Medicaid.  [facility].  Will try to varify today.  This patient has been seen and I agree with the findings and treatment plan.  Marta LamasJames O. Gae BonWyatt, III, MD, FACS [phone] (pager) [phone] (direct pager) Trauma Surgeon
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
## 2013-12-10 NOTE — Progress Notes (Signed)
Patient offered iv analgesic d/t inavailability of liq hydrocodone. Family member at bedside is cursing and complaining of all previous hospital experiences. Family member asked if he would like to discuss his reasons for being upset, he denies and then apologizes for cursing. Patient yells out when iv site touched with alcohol pads prior to flush. Upon inquiring about reason for yelling out, patient states she has an anxiety disorder and is always very afraid. Each step of following medication administration carefully explained prior to proceeding

## 2013-12-10 NOTE — Plan of Care (Signed)
Problem: Phase III Progression Outcomes Goal: Voiding independently Outcome: Not Applicable Date Met:  41/58/30 Pt is anuric

## 2013-12-10 NOTE — Progress Notes (Signed)
Trauma PA paged.

## 2013-12-10 NOTE — Progress Notes (Signed)
PT Cancellation Note  Patient Details Name: Melody Mayer MRN: [id number] DOB: 1986/02/23   Cancelled Treatment:    Reason Eval/Treat Not Completed: Pain limiting ability to participate Pt states that she cannot work with therapy today because she is "too tired" and her "back hurts." Pt educated on importance of mobility out of bed. PT offered to see her later this afternoon and she refuses, stating she needs a back brace if she is going to get OOB. Explained that neuro surgeon felt that it was appropriate for her to mobilize without spinal orthosis. Nurse states all measures have been taken at this time to help reduce pain.   [address] [place] Valley[place]bour, [place] Carolina[place] [phone]  Berton MountLogan S Shantelle Alles 12/10/2013, 2:39 PM
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
## 2013-12-10 NOTE — Clinical Social Work Note (Signed)
Clinical Social Worker continuing to follow patient for support and current substance use.  CSW spoke to patient at bedside with a washcloth over her eyes stating that she is dizzy and can only see spots.  Patient refused to have family step out of the room to further discuss substance use.  Patient referred all conversation to her sister at bedside.  Patient sister states that patient [place] stay [place] [address] [place] or her other sister and mother in [place].  Patient sister states that patient [facility] very [facility] and patient does not want to return where she would have to care for herself and her children alone.  Patient sister was unable to provide concrete decision on patient discharge plans.  Patient is refusing to work with therapies today and stating she has excruciating back pain.  CSW notified RN - RN already aware.  CSW remains available for support and to further discuss current substance use.  Macario GoldsJesse Gaspare Netzel, [place] [phone]

## 2013-12-10 NOTE — Progress Notes (Signed)
6 people in room c/o not enough PT. Patient refused PT 30 min prior. All members of room c/o pt not medicated enough, pt receiving analgesic meds q2h or being offered.

## 2013-12-10 NOTE — Progress Notes (Signed)
Therapist post evaluating patient states she has patient on 2 chemical heating mixes for back pain, both disciplines pursuing heating pad and lumbar support as possibilities for patient d/t poor pain control secondary to back pain. Therapists states otherwise w/o appropriate supportive devices we may possibly have to look at short term nursing home placement. Will page ortho

## 2013-12-11 MED ORDER — ENOXAPARIN SODIUM 40 MG/0.4ML ~~LOC~~ SOLN: 40.0000 mg | SUBCUTANEOUS | Status: AC

## 2013-12-11 MED ORDER — TRAMADOL HCL 50 MG PO TABS: 50.0000 mg | ORAL_TABLET | Freq: Four times a day (QID) | ORAL | Status: AC | PRN
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
## 2013-12-11 NOTE — Clinical Social Work Note (Signed)
Clinical Social Worker continuing to follow patient and family for support and discharge planning needs.  CSW spoke with patient at bedside who continues to deny drug use even with information of positive drug screen.  SBIRT complete per patient report.  [facility] and [facility].  MD has made arrangements for patient to follow up for surgery in [place].   Clinical Social Worker will sign off for now as social work intervention is no longer needed. Please consult us again if new need arises.  Macario GoldsJesse Zohar Laing, [place] [phone]

## 2013-12-11 NOTE — Discharge Summary (Signed)
Sholonda Jobst, MD, MPH, FACS Trauma: [phone] General Surgery: [phone]  
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
## 2013-12-11 NOTE — Progress Notes (Signed)
Pt c/o chest pain. Described as a crushing/pressure/tightness. VSS normal BP 103/51, HR 100, o2 sats 98% on 2L o2, 99.4 temp, RR 20. Pt given 2 mg morphine prior and states that "i think its the pain medicine". Performed a 12 lead EKG, results were normal sinus rhythm. Pt seemed relieved after results came back normal. Pt has no further complaints. Will continue to monitor.
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
## 2013-12-11 NOTE — Progress Notes (Signed)
Rehab Admissions Coordinator Note:  Patient was screened by Clois DupesBarbara Godwin Felicia Both for appropriateness for an Inpatient Acute Rehab Consult per OT recommendation.  At this time, we are recommending continue to follow for therapy progress. Would anticipate that once pain is controlled she will mobilize well.Foye Spurling.  Lakeitha Basques Godwin Burnett Lieber 12/11/2013, 8:01 AM  I can be reached at [phone].
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
## 2013-12-11 NOTE — Discharge Instructions (Signed)
[facility] Discharge Instructions   General Discharge Instructions  WEIGHT BEARING STATUS: NWB R lower extremity and R upper extremity   RANGE OF MOTION/ACTIVITY: R shoulder pendulum and passive shoulder motion only for now. Sling for comfort, ok to remove sling when seated or in bed.  Active motion of R elbow, forearm, wrist and hand  Diet: as you were eating previously.  Can use over the counter stool softeners and bowel preparations, such as Miralax, to help with bowel movements.  Narcotics can be constipating.  Be sure to drink plenty of fluids  STOP SMOKING OR USING NICOTINE PRODUCTS!!!!  As discussed nicotine severely impairs your body's ability to heal surgical and traumatic wounds but also impairs bone healing.  Wounds and bone heal by forming microscopic blood vessels (angiogenesis) and nicotine is a vasoconstrictor (essentially, shrinks blood vessels).  Therefore, if vasoconstriction occurs to these microscopic blood vessels they essentially disappear and are unable to deliver necessary nutrients to the healing tissue.  This is one modifiable factor that you can do to dramatically increase your chances of healing your injury.    (This means no smoking, no nicotine gum, patches, etc)  DO NOT USE NONSTEROIDAL ANTI-INFLAMMATORY DRUGS (NSAID'S)  Using products such as Advil (ibuprofen), Aleve (naproxen), Motrin (ibuprofen) for additional pain control during fracture healing can delay and/or prevent the healing response.  If you would like to take over the counter (OTC) medication, Tylenol (acetaminophen) is ok.  However, some narcotic medications that are given for pain control contain acetaminophen as well. Therefore, you should not exceed more than 4000 mg of tylenol in a day if you do not have liver disease.  Also note that there are may OTC medicines, such as cold medicines and allergy medicines that my contain tylenol as well.  If you have any questions about medications  and/or interactions please ask your doctor/PA or your pharmacist.   PAIN MEDICATION USE AND EXPECTATIONS  You have likely been given narcotic medications to help control your pain.  After a traumatic event that results in an fracture (broken bone) with or without surgery, it is ok to use narcotic pain medications to help control one's pain.  We understand that everyone responds to pain differently and each individual patient will be evaluated on a regular basis for the continued need for narcotic medications. Ideally, narcotic medication use should last no more than 6-8 weeks (coinciding with fracture healing).   As a patient it is your responsibility as well to monitor narcotic medication use and report the amount and frequency you use these medications when you come to your office visit.   We would also advise that if you are using narcotic medications, you should take a dose prior to therapy to maximize you participation.  IF YOU ARE ON NARCOTIC MEDICATIONS IT IS NOT PERMISSIBLE TO OPERATE A MOTOR VEHICLE (MOTORCYCLE/CAR/TRUCK/MOPED) OR HEAVY MACHINERY DO NOT MIX NARCOTICS WITH OTHER CNS (CENTRAL NERVOUS SYSTEM) DEPRESSANTS SUCH AS ALCOHOL       ICE AND ELEVATE INJURED/OPERATIVE EXTREMITY  Using ice and elevating the injured extremity above your heart can help with swelling and pain control.  Icing in a pulsatile fashion, such as 20 minutes on and 20 minutes off, can be followed.    Do not place ice directly on skin. Make sure there is a barrier between to skin and the ice pack.    Using frozen items such as frozen peas works well as the conform nicely to the are that needs to  be iced.  USE AN ACE WRAP OR TED HOSE FOR SWELLING CONTROL  In addition to icing and elevation, Ace wraps or TED hose are used to help limit and resolve swelling.  It is recommended to use Ace wraps or TED hose until you are informed to stop.    When using Ace Wraps start the wrapping distally (farthest away from the  body) and wrap proximally (closer to the body)   Example: If you had surgery on your leg or thing and you do not have a splint on, start the ace wrap at the toes and work your way up to the thigh        If you had surgery on your upper extremity and do not have a splint on, start the ace wrap at your fingers and work your way up to the upper arm  IF YOU ARE IN A SPLINT OR CAST DO NOT REMOVE IT FOR ANY REASON   If your splint gets wet for any reason please contact the office immediately. You may shower in your splint or cast as long as you keep it dry.  This can be done by wrapping in a cast cover or garbage back (or similar)  Do Not stick any thing down your splint or cast such as pencils, money, or hangers to try and scratch yourself with.  If you feel itchy take benadryl as prescribed on the bottle for itching  IF YOU ARE IN A CAM BOOT (BLACK BOOT)  You may remove boot periodically. Perform daily dressing changes as noted below.  Wash the liner of the boot regularly and wear a sock when wearing the boot. It is recommended that you sleep in the boot until told otherwise  CALL THE OFFICE WITH ANY QUESTIONS OR CONCERTS: [phone]     Discharge Pin Site Instructions  Dress pins daily with Kerlix roll starting on POD 2. Wrap the Kerlix so that it tamps the skin down around the pin-skin interface to prevent/limit motion of the skin relative to the pin.  (Pin-skin motion is the primary cause of pain and infection related to external fixator pin sites).  Remove any crust or coagulum that may obstruct drainage with a saline moistened gauze or soap and water.  After POD 3, if there is no discernable drainage on the pin site dressing, the interval for change can by increased to every other day.  You may shower with the fixator, cleaning all pin sites gently with soap and water.  If you have a surgical wound this needs to be completely dry and without drainage before showering.  The extremity can  be lifted by the fixator to facilitate wound care and transfers.  Notify the office/Doctor if you experience increasing drainage, redness, or pain from a pin site, or if you notice purulent (thick, snot-like) drainage.  Discharge Wound Care Instructions  Do NOT apply any ointments, solutions or lotions to pin sites or surgical wounds.  These prevent needed drainage and even though solutions like hydrogen peroxide kill bacteria, they also damage cells lining the pin sites that help fight infection.  Applying lotions or ointments can keep the wounds moist and can cause them to breakdown and open up as well. This can increase the risk for infection. When in doubt call the office.  Surgical incisions should be dressed daily.  If any drainage is noted, use one layer of adaptic, then gauze, Kerlix, and an ace wrap.  Once the incision is completely dry and without  drainage, it may be left open to air out.  Showering may begin 36-48 hours later.  Cleaning gently with soap and water.  Traumatic wounds should be dressed daily as well.    One layer of adaptic, gauze, Kerlix, then ace wrap.  The adaptic can be discontinued once the draining has ceased    If you have a wet to dry dressing: wet the gauze with saline the squeeze as much saline out so the gauze is moist (not soaking wet), place moistened gauze over wound, then place a dry gauze over the moist one, followed by Kerlix wrap, then ace wrap.

## 2013-12-11 NOTE — Progress Notes (Signed)
Family instructed on giving Lovenox inj verbalized understanding

## 2014-06-21 ENCOUNTER — Encounter (HOSPITAL_COMMUNITY): Payer: Self-pay | Admitting: Orthopedic Surgery
